# Patient Record
Sex: Male | Born: 1975 | Race: Black or African American | Hispanic: No | Smoking: Current every day smoker
Health system: Southern US, Community
[De-identification: ages and names within clinical notes are randomized; demographics above are authoritative.]

## PROBLEM LIST (undated history)

## (undated) DIAGNOSIS — R519 Headache, unspecified: Secondary | ICD-10-CM

## (undated) DIAGNOSIS — G8929 Other chronic pain: Secondary | ICD-10-CM

## (undated) DIAGNOSIS — R51 Headache: Secondary | ICD-10-CM

## (undated) DIAGNOSIS — I10 Essential (primary) hypertension: Secondary | ICD-10-CM

## (undated) HISTORY — DX: Essential (primary) hypertension: I10

---

## 1999-01-14 ENCOUNTER — Emergency Department (HOSPITAL_COMMUNITY): Admission: EM | Admit: 1999-01-14 | Discharge: 1999-01-14 | Payer: Self-pay | Admitting: Internal Medicine

## 1999-01-16 ENCOUNTER — Emergency Department (HOSPITAL_COMMUNITY): Admission: EM | Admit: 1999-01-16 | Discharge: 1999-01-16 | Payer: Self-pay | Admitting: Emergency Medicine

## 2006-01-17 ENCOUNTER — Emergency Department (HOSPITAL_COMMUNITY): Admission: EM | Admit: 2006-01-17 | Discharge: 2006-01-17 | Payer: Self-pay | Admitting: Emergency Medicine

## 2007-07-08 ENCOUNTER — Emergency Department (HOSPITAL_COMMUNITY): Admission: EM | Admit: 2007-07-08 | Discharge: 2007-07-08 | Payer: Self-pay | Admitting: Emergency Medicine

## 2008-06-23 ENCOUNTER — Emergency Department (HOSPITAL_COMMUNITY): Admission: EM | Admit: 2008-06-23 | Discharge: 2008-06-23 | Payer: Self-pay | Admitting: Emergency Medicine

## 2009-02-25 ENCOUNTER — Emergency Department (HOSPITAL_COMMUNITY): Admission: EM | Admit: 2009-02-25 | Discharge: 2009-02-25 | Payer: Self-pay | Admitting: Emergency Medicine

## 2010-06-23 ENCOUNTER — Emergency Department (HOSPITAL_COMMUNITY): Admission: EM | Admit: 2010-06-23 | Discharge: 2010-06-23 | Payer: Self-pay | Admitting: Emergency Medicine

## 2014-06-10 ENCOUNTER — Encounter (HOSPITAL_COMMUNITY): Payer: Self-pay | Admitting: Emergency Medicine

## 2014-06-10 DIAGNOSIS — R112 Nausea with vomiting, unspecified: Secondary | ICD-10-CM | POA: Insufficient documentation

## 2014-06-10 DIAGNOSIS — R51 Headache: Secondary | ICD-10-CM | POA: Insufficient documentation

## 2014-06-10 DIAGNOSIS — G8929 Other chronic pain: Secondary | ICD-10-CM | POA: Insufficient documentation

## 2014-06-10 DIAGNOSIS — F17200 Nicotine dependence, unspecified, uncomplicated: Secondary | ICD-10-CM | POA: Insufficient documentation

## 2014-06-10 NOTE — ED Notes (Signed)
Pt. reports chronic headache for several years worse these past several days radiating to his right face with occasional nausea/vomitting .

## 2014-06-11 ENCOUNTER — Emergency Department (HOSPITAL_COMMUNITY)
Admission: EM | Admit: 2014-06-11 | Discharge: 2014-06-11 | Discharge status: Against Medical Advice | Payer: Self-pay | Attending: Emergency Medicine | Admitting: Emergency Medicine

## 2014-06-11 HISTORY — DX: Headache, unspecified: R51.9

## 2014-06-11 HISTORY — DX: Other chronic pain: G89.29

## 2014-06-11 HISTORY — DX: Headache: R51

## 2014-08-10 ENCOUNTER — Inpatient Hospital Stay (HOSPITAL_COMMUNITY)
Admission: EM | Admit: 2014-08-10 | Discharge: 2014-08-14 | DRG: 349 | Disposition: A | Discharge status: Home / Self Care | Payer: Self-pay | Attending: Surgery | Admitting: Surgery

## 2014-08-10 ENCOUNTER — Encounter (HOSPITAL_COMMUNITY): Payer: Self-pay | Admitting: Emergency Medicine

## 2014-08-10 ENCOUNTER — Emergency Department (HOSPITAL_COMMUNITY): Payer: Self-pay

## 2014-08-10 DIAGNOSIS — K5900 Constipation, unspecified: Secondary | ICD-10-CM | POA: Diagnosis present

## 2014-08-10 DIAGNOSIS — F17200 Nicotine dependence, unspecified, uncomplicated: Secondary | ICD-10-CM | POA: Diagnosis present

## 2014-08-10 DIAGNOSIS — K612 Anorectal abscess: Principal | ICD-10-CM | POA: Diagnosis present

## 2014-08-10 DIAGNOSIS — K611 Rectal abscess: Secondary | ICD-10-CM | POA: Diagnosis present

## 2014-08-10 LAB — URINALYSIS, ROUTINE W REFLEX MICROSCOPIC
Bilirubin Urine: NEGATIVE
Glucose, UA: NEGATIVE mg/dL
Hgb urine dipstick: NEGATIVE
KETONES UR: NEGATIVE mg/dL
LEUKOCYTES UA: NEGATIVE
Nitrite: NEGATIVE
Protein, ur: NEGATIVE mg/dL
SPECIFIC GRAVITY, URINE: 1.027 (ref 1.005–1.030)
Urobilinogen, UA: 1 mg/dL (ref 0.0–1.0)
pH: 5.5 (ref 5.0–8.0)

## 2014-08-10 LAB — CBC WITH DIFFERENTIAL/PLATELET
BASOS ABS: 0 10*3/uL (ref 0.0–0.1)
BASOS PCT: 0 % (ref 0–1)
EOS ABS: 0.1 10*3/uL (ref 0.0–0.7)
Eosinophils Relative: 1 % (ref 0–5)
HCT: 39.1 % (ref 39.0–52.0)
HEMOGLOBIN: 13.5 g/dL (ref 13.0–17.0)
LYMPHS ABS: 2.7 10*3/uL (ref 0.7–4.0)
LYMPHS PCT: 26 % (ref 12–46)
MCH: 30.8 pg (ref 26.0–34.0)
MCHC: 34.5 g/dL (ref 30.0–36.0)
MCV: 89.1 fL (ref 78.0–100.0)
MONO ABS: 0.8 10*3/uL (ref 0.1–1.0)
Monocytes Relative: 8 % (ref 3–12)
NEUTROS ABS: 6.7 10*3/uL (ref 1.7–7.7)
NEUTROS PCT: 65 % (ref 43–77)
PLATELETS: 253 10*3/uL (ref 150–400)
RBC: 4.39 MIL/uL (ref 4.22–5.81)
RDW: 13.3 % (ref 11.5–15.5)
WBC: 10.4 10*3/uL (ref 4.0–10.5)

## 2014-08-10 LAB — BASIC METABOLIC PANEL
Anion gap: 15 (ref 5–15)
BUN: 12 mg/dL (ref 6–23)
CHLORIDE: 101 meq/L (ref 96–112)
CO2: 24 mEq/L (ref 19–32)
Calcium: 9.7 mg/dL (ref 8.4–10.5)
Creatinine, Ser: 0.96 mg/dL (ref 0.50–1.35)
GFR calc non Af Amer: >90 mL/min (ref >90)
Glucose, Bld: 105 mg/dL — ABNORMAL HIGH (ref 70–99)
Potassium: 4.1 mEq/L (ref 3.7–5.3)
Sodium: 140 mEq/L (ref 137–147)

## 2014-08-10 MED ORDER — IOHEXOL 300 MG/ML  SOLN
100.0000 mL | Freq: Once | INTRAMUSCULAR | Status: AC | PRN
  Administered 2014-08-10: 100 mL via INTRAVENOUS

## 2014-08-10 MED ORDER — HYDROMORPHONE HCL PF 1 MG/ML IJ SOLN
0.5000 mg | Freq: Once | INTRAMUSCULAR | Status: AC
  Administered 2014-08-10: 0.5 mg via INTRAVENOUS
  Filled 2014-08-10: qty 1

## 2014-08-10 MED ORDER — PIPERACILLIN-TAZOBACTAM 3.375 G IVPB 30 MIN
3.3750 g | Freq: Once | INTRAVENOUS | Status: AC
  Administered 2014-08-11: 3.375 g via INTRAVENOUS
  Filled 2014-08-10: qty 50

## 2014-08-10 MED ORDER — SODIUM CHLORIDE 0.9 % IV BOLUS (SEPSIS)
1000.0000 mL | Freq: Once | INTRAVENOUS | Status: AC
  Administered 2014-08-10: 1000 mL via INTRAVENOUS

## 2014-08-10 MED ORDER — HYDROMORPHONE HCL PF 1 MG/ML IJ SOLN
1.0000 mg | Freq: Once | INTRAMUSCULAR | Status: AC
  Administered 2014-08-10: 1 mg via INTRAVENOUS
  Filled 2014-08-10: qty 1

## 2014-08-10 NOTE — ED Notes (Signed)
Patient thought he had hemorrhoids so he tried to use hemorrhoid cream without any success. Denies any blood in stool, nausea or vomiting.

## 2014-08-10 NOTE — ED Notes (Signed)
Pt. Reports rectal pressure/pain x2-3 days with burning and itching. Denies blood in stool, straining with BM, or pain is genitals. Reports pain is worse with sitting.

## 2014-08-10 NOTE — ED Provider Notes (Signed)
CSN: [id number]     Arrival date & time 08/10/14  1601 History   First MD Initiated Contact with Patient 08/10/14 2119     No chief complaint on file.    (Consider location/radiation/quality/duration/timing/severity/associated sxs/prior Treatment) The history is provided by the patient and the spouse.   Ryen Rhames 38 y.o. who presents with tailbone pain and rectal pain. Worst with sitting down. Slightly improved with standing. No improvement with preparation H. This is moderate in severity. No blood in stool. No dysuria. No history of abscesses. No pain with BMs. No history of hemorrhoids. No anal penetration. No fever, testicle pain.  Past Medical History  Diagnosis Date  . Chronic headache    History reviewed. No pertinent past surgical history. No family history on file. History  Substance Use Topics  . Smoking status: Current Every Day Smoker  . Smokeless tobacco: Not on file  . Alcohol Use: Yes    Review of Systems  All other systems reviewed and are negative.     Allergies  Review of patient's allergies indicates no known allergies.  Home Medications   Prior to Admission medications   Medication Sig Start Date End Date Taking? Authorizing Provider  Pramox-PE-Glycerin-Petrolatum (PREPARATION H RE) Place 1 application rectally daily as needed. Hemorroides   Yes Historical Provider, MD   BP 124/76  Pulse 79  Temp(Src) 98.1 F (36.7 C) (Oral)  Resp 18  SpO2 91% Physical Exam  Nursing note and vitals reviewed. Constitutional: He is oriented to person, place, and time. He appears well-developed and well-nourished. No distress.  HENT:  Head: Normocephalic and atraumatic.  Eyes: Conjunctivae and EOM are normal. Right eye exhibits no discharge. Left eye exhibits no discharge.  Neck: Normal range of motion. Neck supple. No tracheal deviation present.  Cardiovascular: Normal rate, regular rhythm and normal heart sounds.  Exam reveals no friction rub.   No murmur  heard. Pulmonary/Chest: Effort normal and breath sounds normal. No stridor. No respiratory distress. He has no wheezes. He has no rales. He exhibits no tenderness.  Abdominal: Soft. He exhibits no distension. There is no tenderness. There is no rebound and no guarding.  Genitourinary: Rectal exam shows tenderness (at 6 o'clock (posteriorly) and left laterally, with possible fluctuance but exam limited due to pain). Rectal exam shows no external hemorrhoid, no fissure and anal tone normal. Right testis shows no mass, no swelling and no tenderness. Left testis shows no mass, no swelling and no tenderness. No phimosis.  Neurological: He is alert and oriented to person, place, and time.  Skin: Skin is warm.  Psychiatric: He has a normal mood and affect.    ED Course  Procedures (including critical care time) Labs Review Labs Reviewed  URINALYSIS, ROUTINE W REFLEX MICROSCOPIC - Abnormal; Notable for the following:    Color, Urine AMBER (*)    All other components within normal limits  BASIC METABOLIC PANEL - Abnormal; Notable for the following:    Glucose, Bld 105 (*)    All other components within normal limits  CBC WITH DIFFERENTIAL    Imaging Review Ct Pelvis W Contrast  08/10/2014   CLINICAL DATA:  Rectal pressure and pain.  Burning and itching.  EXAM: CT PELVIS WITH CONTRAST  TECHNIQUE: Multidetector CT imaging of the pelvis was performed using the standard protocol following the bolus administration of intravenous contrast.  CONTRAST:  OMNIPAQUE IOHEXOL 300 MG/ML  SOLN  COMPARISON:  None.  FINDINGS: A well-defined hyperdense perirectal collection measures 2.3 x 2.8  x 2.5 cm. The more proximal rectum is within normal limits. Sigmoid colon is unremarkable. The visualized ascending and descending colon are normal. The appendix is visualized and normal. The visualized small bowel is within normal limits.  The urinary bladder prostate gland are normal scratch the urinary bladder is  nondistended, likely accounting for wall thickening. The prostate gland is normal. There is no significant free fluid or adenopathy. Bone windows are unremarkable.  IMPRESSION: 1. Posterior perirectal fluid collection measures 2.3 x 2.8 x 2.5 cm. This is most consistent with an abscess or fistula. 2. Otherwise unremarkable CT of the pelvis.   Electronically Signed   By: Gennette Pac M.D.   On: 08/10/2014 22:46     EKG Interpretation None      MDM   Final diagnoses:  Perirectal abscess    Pt presents with rectal pain. No blood in stool. No hemorrhoids noted. No penetration to anus reportedly. Pain with BMs. + rectal pain with possible fluctuant mass. Dilaudid given for pain. No leukocytosis. BMP wnls. CT pending for possible perirectal abscess. + perirectal abscess. Patient was instructed after I examined him to refrain from eating or drinking anything. Gen surgery consulted. gen surgery to admit. Will keep NPO after midnight. Admitted without any events. Started on Zosyn at surgery recommendation.  Care discussed with my attending, Dr. Bebe Shaggy.     Sena Hitch, MD 08/10/14 2350

## 2014-08-10 NOTE — ED Notes (Signed)
Nurse at bedside when ED Resident assessed patient. Family member at patient's bedside.

## 2014-08-11 ENCOUNTER — Encounter (HOSPITAL_COMMUNITY): Admission: EM | Disposition: A | Discharge status: Home / Self Care | Payer: Self-pay | Source: Home / Self Care

## 2014-08-11 ENCOUNTER — Encounter (HOSPITAL_COMMUNITY): Payer: Self-pay | Admitting: Certified Registered Nurse Anesthetist

## 2014-08-11 ENCOUNTER — Observation Stay (HOSPITAL_COMMUNITY): Payer: Self-pay | Admitting: Certified Registered Nurse Anesthetist

## 2014-08-11 DIAGNOSIS — K612 Anorectal abscess: Secondary | ICD-10-CM

## 2014-08-11 HISTORY — PX: EXAMINATION UNDER ANESTHESIA: SHX1540

## 2014-08-11 HISTORY — PX: INCISION AND DRAINAGE PERIRECTAL ABSCESS: SHX1804

## 2014-08-11 LAB — SURGICAL PCR SCREEN
MRSA, PCR: NEGATIVE
STAPHYLOCOCCUS AUREUS: NEGATIVE

## 2014-08-11 SURGERY — EXAM UNDER ANESTHESIA
Anesthesia: General

## 2014-08-11 MED ORDER — LACTATED RINGERS IV SOLN
INTRAVENOUS | Status: DC | PRN
  Administered 2014-08-11: 09:00:00 via INTRAVENOUS

## 2014-08-11 MED ORDER — FENTANYL CITRATE 0.05 MG/ML IJ SOLN
INTRAMUSCULAR | Status: DC | PRN
  Administered 2014-08-11 (×2): 50 ug via INTRAVENOUS

## 2014-08-11 MED ORDER — MIDAZOLAM HCL 5 MG/5ML IJ SOLN
INTRAMUSCULAR | Status: DC | PRN
  Administered 2014-08-11: 2 mg via INTRAVENOUS

## 2014-08-11 MED ORDER — SUCCINYLCHOLINE CHLORIDE 20 MG/ML IJ SOLN
INTRAMUSCULAR | Status: DC | PRN
  Administered 2014-08-11: 100 mg via INTRAVENOUS

## 2014-08-11 MED ORDER — MIDAZOLAM HCL 2 MG/2ML IJ SOLN
INTRAMUSCULAR | Status: AC
Start: 2014-08-11 — End: 2014-08-11
  Filled 2014-08-11: qty 2

## 2014-08-11 MED ORDER — LIDOCAINE HCL (CARDIAC) 20 MG/ML IV SOLN
INTRAVENOUS | Status: AC
  Filled 2014-08-11: qty 10

## 2014-08-11 MED ORDER — LIDOCAINE HCL (CARDIAC) 20 MG/ML IV SOLN
INTRAVENOUS | Status: DC | PRN
  Administered 2014-08-11: 60 mg via INTRAVENOUS

## 2014-08-11 MED ORDER — SUCCINYLCHOLINE CHLORIDE 20 MG/ML IJ SOLN
INTRAMUSCULAR | Status: AC
  Filled 2014-08-11: qty 2

## 2014-08-11 MED ORDER — FENTANYL CITRATE 0.05 MG/ML IJ SOLN
INTRAMUSCULAR | Status: AC
  Filled 2014-08-11: qty 5

## 2014-08-11 MED ORDER — OXYCODONE HCL 5 MG PO TABS
5.0000 mg | ORAL_TABLET | ORAL | Status: DC | PRN
  Administered 2014-08-11 – 2014-08-13 (×7): 10 mg via ORAL
  Filled 2014-08-11 (×7): qty 2

## 2014-08-11 MED ORDER — ONDANSETRON HCL 4 MG/2ML IJ SOLN
INTRAMUSCULAR | Status: DC | PRN
  Administered 2014-08-11: 4 mg via INTRAVENOUS

## 2014-08-11 MED ORDER — DEXTROSE IN LACTATED RINGERS 5 % IV SOLN
INTRAVENOUS | Status: DC
  Administered 2014-08-11 (×2): via INTRAVENOUS

## 2014-08-11 MED ORDER — ONDANSETRON HCL 4 MG/2ML IJ SOLN
INTRAMUSCULAR | Status: AC
  Filled 2014-08-11: qty 2

## 2014-08-11 MED ORDER — PIPERACILLIN-TAZOBACTAM 3.375 G IVPB
3.3750 g | Freq: Three times a day (TID) | INTRAVENOUS | Status: DC
Start: 2014-08-11 — End: 2014-08-14
  Administered 2014-08-11 – 2014-08-14 (×9): 3.375 g via INTRAVENOUS
  Filled 2014-08-11 (×11): qty 50

## 2014-08-11 MED ORDER — HYDROMORPHONE HCL PF 1 MG/ML IJ SOLN
INTRAMUSCULAR | Status: AC
  Administered 2014-08-11: 0.5 mg via INTRAVENOUS
  Filled 2014-08-11: qty 1

## 2014-08-11 MED ORDER — DEXTROSE IN LACTATED RINGERS 5 % IV SOLN
INTRAVENOUS | Status: DC
  Administered 2014-08-12: 09:00:00 via INTRAVENOUS

## 2014-08-11 MED ORDER — HYDROMORPHONE HCL PF 1 MG/ML IJ SOLN
1.0000 mg | INTRAMUSCULAR | Status: DC | PRN
Start: 2014-08-11 — End: 2014-08-14
  Administered 2014-08-11 – 2014-08-13 (×12): 1 mg via INTRAVENOUS
  Filled 2014-08-11 (×12): qty 1

## 2014-08-11 MED ORDER — PROPOFOL 10 MG/ML IV BOLUS
INTRAVENOUS | Status: AC
  Filled 2014-08-11: qty 20

## 2014-08-11 MED ORDER — ROCURONIUM BROMIDE 50 MG/5ML IV SOLN
INTRAVENOUS | Status: AC
  Filled 2014-08-11: qty 1

## 2014-08-11 MED ORDER — HYDROMORPHONE HCL PF 1 MG/ML IJ SOLN
0.2500 mg | INTRAMUSCULAR | Status: DC | PRN
  Administered 2014-08-11 (×2): 0.5 mg via INTRAVENOUS

## 2014-08-11 MED ORDER — PROPOFOL 10 MG/ML IV BOLUS
INTRAVENOUS | Status: DC | PRN
  Administered 2014-08-11: 50 mg via INTRAVENOUS
  Administered 2014-08-11: 150 mg via INTRAVENOUS

## 2014-08-11 SURGICAL SUPPLY — 39 items
CANISTER SUCTION 2500CC (MISCELLANEOUS) ×4 IMPLANT
CLEANER TIP ELECTROSURG 2X2 (MISCELLANEOUS) IMPLANT
COVER SURGICAL LIGHT HANDLE (MISCELLANEOUS) ×4 IMPLANT
DRAPE PROXIMA HALF (DRAPES) ×2 IMPLANT
DRAPE UTILITY 15X26 W/TAPE STR (DRAPE) ×8 IMPLANT
DRSG PAD ABDOMINAL 8X10 ST (GAUZE/BANDAGES/DRESSINGS) ×4 IMPLANT
ELECT REM PT RETURN 9FT ADLT (ELECTROSURGICAL)
ELECTRODE REM PT RTRN 9FT ADLT (ELECTROSURGICAL) IMPLANT
GAUZE PACKING IODOFORM 1 (PACKING) IMPLANT
GAUZE PACKING IODOFORM 1/4X15 (GAUZE/BANDAGES/DRESSINGS) ×2 IMPLANT
GAUZE SPONGE 4X4 12PLY STRL (GAUZE/BANDAGES/DRESSINGS) ×4 IMPLANT
GAUZE SPONGE 4X4 16PLY XRAY LF (GAUZE/BANDAGES/DRESSINGS) ×4 IMPLANT
GEL ULTRASOUND 8.5O AQUASONIC (MISCELLANEOUS) ×4 IMPLANT
GLOVE BIO SURGEON STRL SZ8 (GLOVE) ×4 IMPLANT
GLOVE BIOGEL PI IND STRL 7.0 (GLOVE) IMPLANT
GLOVE BIOGEL PI IND STRL 8 (GLOVE) ×2 IMPLANT
GLOVE BIOGEL PI INDICATOR 7.0 (GLOVE) ×2
GLOVE BIOGEL PI INDICATOR 8 (GLOVE) ×2
GOWN STRL REUS W/ TWL LRG LVL3 (GOWN DISPOSABLE) ×4 IMPLANT
GOWN STRL REUS W/ TWL XL LVL3 (GOWN DISPOSABLE) ×2 IMPLANT
GOWN STRL REUS W/TWL LRG LVL3 (GOWN DISPOSABLE) ×8
GOWN STRL REUS W/TWL XL LVL3 (GOWN DISPOSABLE) ×4
KIT BASIN OR (CUSTOM PROCEDURE TRAY) ×4 IMPLANT
KIT ROOM TURNOVER OR (KITS) ×4 IMPLANT
NS IRRIG 1000ML POUR BTL (IV SOLUTION) ×4 IMPLANT
PACK LITHOTOMY IV (CUSTOM PROCEDURE TRAY) ×4 IMPLANT
PAD ABD 8X10 STRL (GAUZE/BANDAGES/DRESSINGS) ×2 IMPLANT
PAD ARMBOARD 7.5X6 YLW CONV (MISCELLANEOUS) ×8 IMPLANT
PENCIL BUTTON HOLSTER BLD 10FT (ELECTRODE) IMPLANT
SWAB COLLECTION DEVICE MRSA (MISCELLANEOUS) ×4 IMPLANT
SYR BULB IRRIGATION 50ML (SYRINGE) ×2 IMPLANT
TOWEL OR 17X24 6PK STRL BLUE (TOWEL DISPOSABLE) ×4 IMPLANT
TOWEL OR 17X26 10 PK STRL BLUE (TOWEL DISPOSABLE) ×4 IMPLANT
TUBE ANAEROBIC SPECIMEN COL (MISCELLANEOUS) ×4 IMPLANT
TUBE CONNECTING 12'X1/4 (SUCTIONS) ×1
TUBE CONNECTING 12X1/4 (SUCTIONS) ×3 IMPLANT
UNDERPAD 30X30 INCONTINENT (UNDERPADS AND DIAPERS) ×4 IMPLANT
WATER STERILE IRR 1000ML POUR (IV SOLUTION) ×4 IMPLANT
YANKAUER SUCT BULB TIP NO VENT (SUCTIONS) ×4 IMPLANT

## 2014-08-11 NOTE — Progress Notes (Signed)
Patient ID: Paul Prince, male   DOB: [birth year], 38 y.o.   MRN: [id number] Patient examined and I agree with Dr. Arita Miss assessment. For EUA/drainage perirectal abscess. Porcedure, risks, benefits D/W him and he agrees.  Violeta Gelinas, MD, MPH, FACS Trauma: [phone] General Surgery: [phone]  08/11/2014 7:42 AM

## 2014-08-11 NOTE — H&P (Signed)
Paul Prince is an 37 y.o. male.   Chief Complaint: rectal pain HPI:  [name] admitted with worsening rectal pain for several days.  He has had subjective fever/ chills.  He denies drainage.  He has not had this happen before.  He denies bleeding.  He thought it was a hemorrhoid and was using preparation H without relief.  He has had some constipation.    Past Medical History  Diagnosis Date  . Chronic headache     History reviewed. No pertinent past surgical history.  No family history on file. Social History:  reports that he has been smoking.  He does not have any smokeless tobacco history on file. He reports that he drinks alcohol. He reports that he does not use illicit drugs.  Allergies: No Known Allergies  Medications Prior to Admission  Medication Sig Dispense Refill  . Pramox-PE-Glycerin-Petrolatum (PREPARATION H RE) Place 1 application rectally daily as needed. Hemorroides        Results for orders placed during the hospital encounter of 08/10/14 (from the past 48 hour(s))  CBC WITH DIFFERENTIAL     Status: None   Collection Time    08/10/14  9:55 PM      Result Value Ref Range   WBC 10.4  4.0 - 10.5 K/uL   RBC 4.39  4.22 - 5.81 MIL/uL   Hemoglobin 13.5  13.0 - 17.0 g/dL   HCT 39.1  39.0 - 52.0 %   MCV 89.1  78.0 - 100.0 fL   MCH 30.8  26.0 - 34.0 pg   MCHC 34.5  30.0 - 36.0 g/dL   RDW 13.3  11.5 - 15.5 %   Platelets 253  150 - 400 K/uL   Neutrophils Relative % 65  43 - 77 %   Neutro Abs 6.7  1.7 - 7.7 K/uL   Lymphocytes Relative 26  12 - 46 %   Lymphs Abs 2.7  0.7 - 4.0 K/uL   Monocytes Relative 8  3 - 12 %   Monocytes Absolute 0.8  0.1 - 1.0 K/uL   Eosinophils Relative 1  0 - 5 %   Eosinophils Absolute 0.1  0.0 - 0.7 K/uL   Basophils Relative 0  0 - 1 %   Basophils Absolute 0.0  0.0 - 0.1 K/uL  BASIC METABOLIC PANEL     Status: Abnormal   Collection Time    08/10/14  9:55 PM      Result Value Ref Range   Sodium 140  137 - 147 mEq/L   Potassium 4.1   3.7 - 5.3 mEq/L   Chloride 101  96 - 112 mEq/L   CO2 24  19 - 32 mEq/L   Glucose, Bld 105 (*) 70 - 99 mg/dL   BUN 12  6 - 23 mg/dL   Creatinine, Ser 0.96  0.50 - 1.35 mg/dL   Calcium 9.7  8.4 - 10.5 mg/dL   GFR calc non Af Amer >90  >90 mL/min   GFR calc Af Amer >90  >90 mL/min   Comment: (NOTE)     The eGFR has been calculated using the CKD EPI equation.     This calculation has not been validated in all clinical situations.     eGFR's persistently <90 mL/min signify possible Chronic Kidney     Disease.   Anion gap 15  5 - 15  URINALYSIS, ROUTINE W REFLEX MICROSCOPIC     Status: Abnormal   Collection Time      08/10/14 10:05 PM      Result Value Ref Range   Color, Urine AMBER (*) YELLOW   Comment: BIOCHEMICALS MAY BE AFFECTED BY COLOR   APPearance CLEAR  CLEAR   Specific Gravity, Urine 1.027  1.005 - 1.030   pH 5.5  5.0 - 8.0   Glucose, UA NEGATIVE  NEGATIVE mg/dL   Hgb urine dipstick NEGATIVE  NEGATIVE   Bilirubin Urine NEGATIVE  NEGATIVE   Ketones, ur NEGATIVE  NEGATIVE mg/dL   Protein, ur NEGATIVE  NEGATIVE mg/dL   Urobilinogen, UA 1.0  0.0 - 1.0 mg/dL   Nitrite NEGATIVE  NEGATIVE   Leukocytes, UA NEGATIVE  NEGATIVE   Comment: MICROSCOPIC NOT DONE ON URINES WITH NEGATIVE PROTEIN, BLOOD, LEUKOCYTES, NITRITE, OR GLUCOSE <1000 mg/dL.   Ct Pelvis W Contrast  08/10/2014   CLINICAL DATA:  Rectal pressure and pain.  Burning and itching.  EXAM: CT PELVIS WITH CONTRAST  TECHNIQUE: Multidetector CT imaging of the pelvis was performed using the standard protocol following the bolus administration of intravenous contrast.  CONTRAST:  100mL OMNIPAQUE IOHEXOL 300 MG/ML  SOLN  COMPARISON:  None.  FINDINGS: A well-defined hyperdense perirectal collection measures 2.3 x 2.8 x 2.5 cm. The more proximal rectum is within normal limits. Sigmoid colon is unremarkable. The visualized ascending and descending colon are normal. The appendix is visualized and normal. The visualized small bowel is within  normal limits.  The urinary bladder prostate gland are normal scratch the urinary bladder is nondistended, likely accounting for wall thickening. The prostate gland is normal. There is no significant free fluid or adenopathy. Bone windows are unremarkable.  IMPRESSION: 1. Posterior perirectal fluid collection measures 2.3 x 2.8 x 2.5 cm. This is most consistent with an abscess or fistula. 2. Otherwise unremarkable CT of the pelvis.   Electronically Signed   By: Chris  Mattern M.D.   On: 08/10/2014 22:46    Review of Systems  Constitutional: Positive for chills.  Gastrointestinal:       Rectal pain  All other systems reviewed and are negative.   Blood pressure 93/62, pulse 75, temperature 98.6 F (37 C), temperature source Oral, resp. rate 18, SpO2 97.00%. Physical Exam  Constitutional: He is oriented to person, place, and time. He appears well-developed and well-nourished.  HENT:  Head: Normocephalic and atraumatic.  Eyes: Conjunctivae are normal. Pupils are equal, round, and reactive to light. No scleral icterus.  Neck: Normal range of motion. Neck supple.  Cardiovascular: Normal rate.   Respiratory: Effort normal. No respiratory distress.  GI: Soft. He exhibits no distension.  Genitourinary:     pain  Musculoskeletal: Normal range of motion. He exhibits no edema.  Neurological: He is alert and oriented to person, place, and time.  Skin: Skin is warm and dry. No rash noted. No erythema. No pallor.  Psychiatric: He has a normal mood and affect. His behavior is normal. Judgment and thought content normal.     Assessment/Plan Perirectal abscess IV antibiotics NPO To OR for EUA with Dr. Thompson.   Reviewed that wound would be left open and may require dressing changes.    Paul Prince 08/11/2014, 6:49 AM    

## 2014-08-11 NOTE — Transfer of Care (Signed)
Immediate Anesthesia Transfer of Care Note  Patient: Paul Prince  Procedure(s) Performed: Procedure(s): EXAM UNDER ANESTHESIA (N/A) IRRIGATION AND DEBRIDEMENT PERIRECTAL ABSCESS  Patient Location: PACU  Anesthesia Type:General  Level of Consciousness: awake, alert  and oriented  Airway & Oxygen Therapy: Patient Spontanous Breathing and Patient connected to nasal cannula oxygen  Post-op Assessment: Report given to PACU RN, Post -op Vital signs reviewed and stable and Patient moving all extremities X 4  Post vital signs: Reviewed and stable  Complications: No apparent anesthesia complications

## 2014-08-11 NOTE — Op Note (Signed)
08/10/2014 - 08/11/2014  9:42 AM  PATIENT:  Paul Prince  38 y.o. male  PRE-OPERATIVE DIAGNOSIS:  Perirectal Abscess  POST-OPERATIVE DIAGNOSIS:  Perirectal Abscess  PROCEDURE:  Procedure(s): EXAM UNDER ANESTHESIA INCISION AND DRAINAGE PERIRECTAL ABSCESS  SURGEON:  Surgeon(s): Liz Malady, MD  ASSISTANTS: none   ANESTHESIA:   general  EBL:  Total I/O In: 700 [I.V.:700] Out: 300 [Urine:300]  BLOOD ADMINISTERED:none  DRAINS: none   SPECIMEN:  No Specimen  DISPOSITION OF SPECIMEN:  N/A  COUNTS:  YES  DICTATION: .Dragon Dictation  Patient presents for examination under anesthesia and drainage of perirectal abscess. He was identified in the palpable area. He received intravenous antibiotics this morning. He was brought to the operating room and general anesthesia was administered by the anesthesia staff. Was placed in lithotomy position. Perianal area was prepped and draped in sterile fashion. We did time out procedure. Digital rectal exam revealed no palpable mass posteriorly where the abscess was located. No other masses were noted. Incision was made posterior to the opening. Blunt dissection was used to track along the backside of the rectal area and I entered a pocket of purulent material. The pocket was opened bluntly and contents were sent for cultures. No other loculations were noted. Wound was copiously irrigated and then packed with quarter-inch iodoform gauze. I repeated his rectal exam and no connection to the rectum was noted. A bulky sterile dressing was applied. All counts were correct. Patient tolerated the procedure well without apparent complication was taken recovery in stable condition.  PATIENT DISPOSITION:  PACU - hemodynamically stable.   Delay start of Pharmacological VTE agent (>24hrs) due to surgical blood loss or risk of bleeding:  no  Violeta Gelinas, MD, MPH, FACS Pager: [phone]  8/29/20159:42 AM

## 2014-08-11 NOTE — Anesthesia Preprocedure Evaluation (Addendum)
Anesthesia Evaluation  Patient identified by MRN, date of birth, ID band Patient awake  General Assessment Comment:History noted. CE  Reviewed: Allergy & Precautions, H&P , NPO status , Patient's Chart, lab work & pertinent test results  Airway Mallampati: II TM Distance: >3 FB Neck ROM: Full    Dental  (+) Dental Advisory Given, Teeth Intact   Pulmonary Current Smoker,  breath sounds clear to auscultation        Cardiovascular negative cardio ROS  Rhythm:Regular Rate:Normal     Neuro/Psych  Headaches,    GI/Hepatic negative GI ROS, Neg liver ROS,   Endo/Other  negative endocrine ROS  Renal/GU negative Renal ROS     Musculoskeletal   Abdominal   Peds  Hematology   Anesthesia Other Findings   Reproductive/Obstetrics                        Anesthesia Physical Anesthesia Plan  ASA: II  Anesthesia Plan: General   Post-op Pain Management:    Induction: Intravenous  Airway Management Planned: Oral ETT  Additional Equipment:   Intra-op Plan:   Post-operative Plan: Extubation in OR  Informed Consent: I have reviewed the patients History and Physical, chart, labs and discussed the procedure including the risks, benefits and alternatives for the proposed anesthesia with the patient or authorized representative who has indicated his/her understanding and acceptance.   Dental advisory given  Plan Discussed with: CRNA, Anesthesiologist and Surgeon  Anesthesia Plan Comments:        Anesthesia Quick Evaluation

## 2014-08-11 NOTE — Progress Notes (Signed)
UR completed 

## 2014-08-11 NOTE — Anesthesia Postprocedure Evaluation (Signed)
  Anesthesia Post-op Note  Patient: Paul Prince  Procedure(s) Performed: Procedure(s): EXAM UNDER ANESTHESIA (N/A) IRRIGATION AND DEBRIDEMENT PERIRECTAL ABSCESS  Patient Location: PACU  Anesthesia Type:General  Level of Consciousness: awake  Airway and Oxygen Therapy: Patient Spontanous Breathing  Post-op Pain: mild  Post-op Assessment: Post-op Vital signs reviewed  Post-op Vital Signs: Reviewed  Last Vitals:  Filed Vitals:   08/11/14 1011  BP: 136/88  Pulse: 60  Temp:   Resp: 15    Complications: No apparent anesthesia complications

## 2014-08-11 NOTE — ED Provider Notes (Signed)
I have personally seen and examined the patient.  I have discussed the plan of care with the resident.  I have reviewed the documentation on PMH/FH/Soc. History.  I have reviewed the documentation of the resident and agree.  Pt noted to have perirectal abscess He was stable in the ED D/w dr Donell Beers, will admit to gen. surgery  Joya Gaskins, MD 08/11/14 [phone]

## 2014-08-11 NOTE — ED Notes (Signed)
Report given to [facility], Charity fundraiser.

## 2014-08-12 MED ORDER — ENOXAPARIN SODIUM 40 MG/0.4ML ~~LOC~~ SOLN
40.0000 mg | SUBCUTANEOUS | Status: DC
  Administered 2014-08-12: 40 mg via SUBCUTANEOUS
  Filled 2014-08-12 (×3): qty 0.4

## 2014-08-12 NOTE — Progress Notes (Signed)
1 Day Post-Op  Subjective: Alert and stable. Friendly and cooperative. Says he's  still having some pain and drainage. Afebrile. Heart rate 69. BP 118/65.  Objective: Vital signs in last 24 hours: Temp:  [97.2 F (36.2 C)-99.3 F (37.4 C)] 99.3 F (37.4 C) (08/30 0700) Pulse Rate:  [60-83] 69 (08/30 0700) Resp:  [10-18] 16 (08/30 0700) BP: (111-143)/(50-88) 118/55 mmHg (08/30 0700) SpO2:  [96 %-100 %] 98 % (08/30 0700) Weight:  [190 lb (86.183 kg)] 190 lb (86.183 kg) (08/30 0236) Last BM Date: 08/08/14  Intake/Output from previous day: 08/29 0701 - 08/30 0700 In: 2999.2 [P.O.:720; I.V.:2179.2; IV Piggyback:100] Out: 1130 [Urine:1130] Intake/Output this shift: Total I/O In: -  Out: 600 [Urine:600]   EXAM: General appearance: Alert. Cooperative. No real distress. Mental status normal. GI: soft, non-tender; bowel sounds normal; no masses,  no organomegaly Incision/Wound:  Rectal area examined. Moderate old blood on bandage. Drain removed. No active bleeding. The wound looks good.  Lab Results:  Results for orders placed during the hospital encounter of 08/10/14 (from the past 24 hour(s))  CULTURE, ROUTINE-ABSCESS     Status: None   Collection Time    08/11/14  9:33 AM      Result Value Ref Range   Specimen Description ABSCESS PERIRECTAL     Special Requests PATIENT ON FOLLOWING ZOSYN     Gram Stain PENDING     Culture       Value: NO GROWTH 1 DAY     Performed at [facility]   Report Status PENDING       Studies/Results: No results found.  . piperacillin-tazobactam (ZOSYN)  IV  3.375 g Intravenous 3 times per day     Assessment/Plan: s/p Procedure(s): EXAM UNDER ANESTHESIA IRRIGATION AND DEBRIDEMENT PERIRECTAL ABSCESS  POD #1. Incision and drainage of pararectal abscess, posterior midline. Doing well   drain removed today Begin sitz baths Continue Zosyn Continue hospitalization for observation for bleeding and pain control  hopefully home  tomorrow.  @  LOS: 2 days    Jud Fanguy M 08/12/2014  . .prob

## 2014-08-12 NOTE — Progress Notes (Signed)
Sitz bath done with assistance by SO.Marland Kitchen  Blood clots noted falling down on sitz bath.  Pt. States "i'm quite relieve".

## 2014-08-13 LAB — APTT: APTT: 41 s — AB (ref 24–37)

## 2014-08-13 LAB — CBC
HCT: 30.5 % — ABNORMAL LOW (ref 39.0–52.0)
Hemoglobin: 10.6 g/dL — ABNORMAL LOW (ref 13.0–17.0)
MCH: 30.6 pg (ref 26.0–34.0)
MCHC: 34.8 g/dL (ref 30.0–36.0)
MCV: 88.2 fL (ref 78.0–100.0)
PLATELETS: 264 10*3/uL (ref 150–400)
RBC: 3.46 MIL/uL — ABNORMAL LOW (ref 4.22–5.81)
RDW: 13.1 % (ref 11.5–15.5)
WBC: 10.5 10*3/uL (ref 4.0–10.5)

## 2014-08-13 LAB — PROTIME-INR
INR: 1.13 (ref 0.00–1.49)
PROTHROMBIN TIME: 14.5 s (ref 11.6–15.2)

## 2014-08-13 MED ORDER — POLYETHYLENE GLYCOL 3350 17 G PO PACK
17.0000 g | PACK | Freq: Once | ORAL | Status: AC
  Administered 2014-08-13: 17 g via ORAL
  Filled 2014-08-13: qty 1

## 2014-08-13 MED ORDER — DOCUSATE SODIUM 100 MG PO CAPS
100.0000 mg | ORAL_CAPSULE | Freq: Two times a day (BID) | ORAL | Status: DC
  Administered 2014-08-13 (×2): 100 mg via ORAL
  Filled 2014-08-13: qty 1

## 2014-08-13 NOTE — Progress Notes (Addendum)
2 Days Post-Op  Subjective: Alert. Feeling better. Passing lots of flatus but hasn't had a stool yet. Patient and significant other concern because he still having bloody drainage. Fearful that he will bleed at home. Nursing staff states he does have bloody clots anddrainage on bandage. Bandage was changed 4 times yesterday. Has had one sitz bath. Afebrile. Stable.  Objective: Vital signs in last 24 hours: Temp:  [99.3 F (37.4 C)-99.5 F (37.5 C)] 99.3 F (37.4 C) (08/31 0446) Pulse Rate:  [69-85] 85 (08/31 0446) Resp:  [16-18] 17 (08/31 0446) BP: (110-122)/(55-63) 110/59 mmHg (08/31 0446) SpO2:  [97 %-99 %] 98 % (08/31 0446) Last BM Date: 08/08/14  Intake/Output from previous day: 08/30 0701 - 08/31 0700 In: 2408.3 [P.O.:1060; I.V.:1198.3; IV Piggyback:150] Out: 3050 [Urine:3050] Intake/Output this shift: Total I/O In: 1230 [P.O.:580; I.V.:550; IV Piggyback:100] Out: 1100 [Urine:1100]  General appearance: alert. Cooperative. Friendly.  No distress. Mental status normal. GI: soft, non-tender; bowel sounds normal; no masses,  no organomegaly Skin: Skin color, texture, turgor normal. No rashes or lesions or perianal incision looks good. No purulence or odor. Cellulitis abscess. Blood clot present but no active bleeding. Redressed  Lab Results:  No results found for this or any previous visit (from the past 24 hour(s)).   Studies/Results: No results found.  . enoxaparin (LOVENOX) injection  40 mg Subcutaneous Q24H  . piperacillin-tazobactam (ZOSYN)  IV  3.375 g Intravenous 3 times per day     Assessment/Plan: s/p Procedure(s): EXAM UNDER ANESTHESIA IRRIGATION AND DEBRIDEMENT PERIRECTAL ABSCESS  POD #2. Incision and drainage of perirectal abscess, posterior midline.  Doing well, Suspected the bleeding will be self-limited. Check a CBC and coags today. drain removed yesterday Sitz  TID Continue Zosyn  Continue hospitalization for observation for bleeding and pain  control  hopefully home tomorrow.   @  LOS: 3 days    Tyrian Peart M 08/13/2014  . .prob

## 2014-08-14 ENCOUNTER — Encounter (HOSPITAL_COMMUNITY): Payer: Self-pay | Admitting: General Surgery

## 2014-08-14 MED ORDER — AMOXICILLIN-POT CLAVULANATE 875-125 MG PO TABS: 1.0000 | ORAL_TABLET | Freq: Two times a day (BID) | ORAL | Status: DC

## 2014-08-14 MED ORDER — OXYCODONE HCL 5 MG PO TABS: 5.0000 mg | ORAL_TABLET | ORAL | Status: DC | PRN

## 2014-08-14 NOTE — Care Management Note (Signed)
  Page 1 of 1   08/14/2014     1:09:09 PM CARE MANAGEMENT NOTE 08/14/2014  Patient:  Paul Prince, Paul Prince   Account Number:  [id number]  Date Initiated:  08/14/2014  Documentation initiated by:  Ronny Flurry  Subjective/Objective Assessment:     Action/Plan:   Anticipated DC Date:  08/14/2014   Anticipated DC Plan:  HOME/SELF CARE  In-house referral  Financial Counselor      DC Planning Services  [facility]  [facility]      Choice offered to / List presented to:             Status of service:   Medicare Important Message given?   (If response is "NO", the following Medicare IM given date fields will be blank) Date Medicare IM given:   Medicare IM given by:   Date Additional Medicare IM given:   Additional Medicare IM given by:    Discharge Disposition:    Per UR Regulation:    If discussed at Long Length of Stay Meetings, dates discussed:    Comments:  08-14-14 MATCH letter given , exception placed for 14 day supply of pain medication. Ronny Flurry RN BSN

## 2014-08-14 NOTE — Discharge Summary (Signed)
Physician Discharge Summary  Patient ID: Paul Prince MRN: [id number] DOB/AGE: Sep 15, 1976 38 y.o.  Admit date: 08/10/2014 Discharge date: 08/14/2014  Admission Diagnoses:Posterior perirectal abscess  Discharge Diagnoses: Posterior perirectal abscess Active Problems:   Perirectal abscess   Discharged Condition: good  Hospital Course: Patient was admitted with a posterior perirectal abscess. He underwent examination under anesthesia and drainage of the abscess in the operating room. Postoperatively he had some oozing and he was monitored for an extra day. He was treated with IV antibiotics. Cultures were negative as of time of discharge. The bleeding stopped and he is discharged home in stable condition.  Consults: None  Significant Diagnostic Studies: CT  Treatments: surgery: Above  Discharge Exam: Blood pressure 108/61, pulse 73, temperature 99.4 F (37.4 C), temperature source Oral, resp. rate 17, height 6' (1.829 m), weight 190 lb (86.183 kg), SpO2 100.00%. General appearance: alert and cooperative wound much softer with no significant drainage  Disposition: 07-Left Against Medical Advice  Discharge Instructions   Diet - low sodium heart healthy    Complete by:  As directed      Discharge instructions    Complete by:  As directed   Sit in a tub twice a day, especially after bowel movements. Keep wound covered with gauze until drainage stops.     Discharge wound care:    Complete by:  As directed   See above     Increase activity slowly    Complete by:  As directed             Medication List    STOP taking these medications       PREPARATION H RE      TAKE these medications       oxyCODONE 5 MG immediate release tablet  Commonly known as:  Oxy IR/ROXICODONE  Take 1-2 tablets (5-10 mg total) by mouth every 4 (four) hours as needed (pain).       Augmentin  1 by mouth twice a day for 7 days     Follow-up Information   Follow up with [facility]  E, MD. Schedule an appointment as soon as possible for a visit in 2 weeks. (For wound re-check)    Specialty:  General Surgery   Contact information:   [address] [place] [address] [phone]       Signed: Liz Malady 08/14/2014, 8:58 AM

## 2014-08-14 NOTE — Progress Notes (Signed)
Patient given discharge paperwork. Prescriptions given to patient. Patient did not voice any questions or concerns. Patient is ready for discharge.

## 2014-08-15 LAB — CULTURE, ROUTINE-ABSCESS
CULTURE: NO GROWTH
Gram Stain: NONE SEEN

## 2014-08-16 LAB — ANAEROBIC CULTURE: Gram Stain: NONE SEEN

## 2014-08-27 ENCOUNTER — Encounter (INDEPENDENT_AMBULATORY_CARE_PROVIDER_SITE_OTHER): Payer: Self-pay

## 2014-10-23 ENCOUNTER — Encounter: Payer: Self-pay | Admitting: Geriatric Medicine

## 2014-10-23 ENCOUNTER — Ambulatory Visit (INDEPENDENT_AMBULATORY_CARE_PROVIDER_SITE_OTHER): Payer: Managed Care, Other (non HMO) | Admitting: Internal Medicine

## 2014-10-23 ENCOUNTER — Encounter: Payer: Self-pay | Admitting: Internal Medicine

## 2014-10-23 ENCOUNTER — Other Ambulatory Visit (INDEPENDENT_AMBULATORY_CARE_PROVIDER_SITE_OTHER): Payer: Managed Care, Other (non HMO)

## 2014-10-23 VITALS — BP 148/82 | HR 80 | Temp 98.6°F | Resp 14 | Ht 72.0 in | Wt 205.0 lb

## 2014-10-23 DIAGNOSIS — G44009 Cluster headache syndrome, unspecified, not intractable: Secondary | ICD-10-CM | POA: Insufficient documentation

## 2014-10-23 DIAGNOSIS — R7302 Impaired glucose tolerance (oral): Secondary | ICD-10-CM

## 2014-10-23 DIAGNOSIS — Z0000 Encounter for general adult medical examination without abnormal findings: Secondary | ICD-10-CM

## 2014-10-23 DIAGNOSIS — K611 Rectal abscess: Secondary | ICD-10-CM

## 2014-10-23 DIAGNOSIS — Z13220 Encounter for screening for lipoid disorders: Secondary | ICD-10-CM

## 2014-10-23 DIAGNOSIS — Z720 Tobacco use: Secondary | ICD-10-CM

## 2014-10-23 DIAGNOSIS — G44019 Episodic cluster headache, not intractable: Secondary | ICD-10-CM

## 2014-10-23 LAB — LIPID PANEL
Cholesterol: 170 mg/dL (ref 0–200)
HDL: 33.9 mg/dL — ABNORMAL LOW (ref >39.00)
LDL CALC: 113 mg/dL — AB (ref 0–99)
NonHDL: 136.1
TRIGLYCERIDES: 117 mg/dL (ref 0.0–149.0)
Total CHOL/HDL Ratio: 5
VLDL: 23.4 mg/dL (ref 0.0–40.0)

## 2014-10-23 LAB — BASIC METABOLIC PANEL
BUN: 14 mg/dL (ref 6–23)
CHLORIDE: 104 meq/L (ref 96–112)
CO2: 30 meq/L (ref 19–32)
Calcium: 9.4 mg/dL (ref 8.4–10.5)
Creatinine, Ser: 0.9 mg/dL (ref 0.4–1.5)
GFR: 119.21 mL/min (ref >60.00)
GLUCOSE: 90 mg/dL (ref 70–99)
POTASSIUM: 3.9 meq/L (ref 3.5–5.1)
SODIUM: 139 meq/L (ref 135–145)

## 2014-10-23 LAB — HEMOGLOBIN A1C: Hgb A1c MFr Bld: 5.4 % (ref 4.6–6.5)

## 2014-10-23 MED ORDER — SUMATRIPTAN SUCCINATE 25 MG PO TABS: 25.0000 mg | ORAL_TABLET | ORAL | Status: DC | PRN

## 2014-10-23 MED ORDER — VERAPAMIL HCL ER 120 MG PO CP24: 120.0000 mg | ORAL_CAPSULE | Freq: Two times a day (BID) | ORAL | Status: DC

## 2014-10-23 NOTE — Assessment & Plan Note (Signed)
Check lipid panel, hemoglobin A1c, basic metabolic panel.

## 2014-10-23 NOTE — Assessment & Plan Note (Signed)
We will start verapamil 120 mg twice a day for prevention of cluster headaches. Will also give prescription for sumatriptan 25 mg #10 no refills to be used as abortive therapy. Advised that he quit smoking. Encouraged his alcohol cessation and given information about cluster headaches.

## 2014-10-23 NOTE — Assessment & Plan Note (Signed)
He is currently trying to quit. Talked to him about tips for success.

## 2014-10-23 NOTE — Progress Notes (Signed)
Pre visit review using our clinic review tool, if applicable. No additional management support is needed unless otherwise documented below in the visit note. 

## 2014-10-23 NOTE — Assessment & Plan Note (Signed)
Finished antibiotics and fully resolved.

## 2014-10-23 NOTE — Progress Notes (Signed)
   Subjective:    Patient ID: Paul Prince, male    DOB: Oct 11, 1976, 38 y.o.   MRN: [id number]  HPI The patient is a 38 year old man who comes in today to establish care. His past medical history of rectal abscess within the last 6 months, tobacco abuse. He also has been struggling with cluster headaches for the past couple of years off and on. The last several months he's had quite a few cluster headaches. He notices that smoking set them off, alcohol sets them off. He has given up alcohol altogether he can avoid headaches. He states that he gets a familiar sensation when he starts to have the headache on the left side of his face and in his nose and then proceeds to have his one-sided headache which is excruciating in nature lasting anywhere from 30 minutes to an hour. He states that if he takes a BC powder when he gets that initial sensation sometimes he can avoid the large headache however mostly he has no good treatment.  Review of Systems  Constitutional: Negative for fever, activity change, appetite change, fatigue and unexpected weight change.  Respiratory: Negative for cough, chest tightness, shortness of breath and wheezing.   Cardiovascular: Negative for chest pain, palpitations and leg swelling.  Gastrointestinal: Negative for nausea, abdominal pain, diarrhea, constipation and abdominal distention.  Musculoskeletal: Negative.   Neurological: Positive for headaches. Negative for dizziness, seizures, weakness, light-headedness and numbness.  Psychiatric/Behavioral: Negative.       Objective:   Physical Exam  Constitutional: He is oriented to person, place, and time. He appears well-developed and well-nourished. No distress.  HENT:  Head: Normocephalic and atraumatic.  Eyes: EOM are normal.  Neck: Normal range of motion.  Cardiovascular: Normal rate and regular rhythm.   Pulmonary/Chest: Effort normal and breath sounds normal. No respiratory distress. He has no wheezes. He has no  rales.  Abdominal: Soft. Bowel sounds are normal. He exhibits no distension. There is no tenderness. There is no rebound.  Neurological: He is alert and oriented to person, place, and time. Coordination normal.  Skin: Skin is warm and dry.   Filed Vitals:   10/23/14 1529  BP: 148/82  Pulse: 80  Temp: 98.6 F (37 C)  TempSrc: Oral  Resp: 14  Height: 6' (1.829 m)  Weight: 205 lb (92.987 kg)  SpO2: 96%      Assessment & Plan:

## 2014-10-23 NOTE — Patient Instructions (Signed)
We will have you start taking a medicine called verapamil to help you have less headaches. Take 1 pill twice a day. It may take up to 2 weeks until you notice the full benefit of this medicine.  The other medicine we have sent and is called sumatriptan which is a medicine you can take if you start thinking her going to have a headache to help abort the headache. If the medicine is too strong you can cut it in half and take a half when he noticed you are going to have a headache.  We will check some blood work today and call you back with the results of that to make sure there is nothing they could be contributing to your headaches.  Cluster Headache Cluster headaches are recognized by their pattern of deep, intense head pain. They normally occur on one side of your head, but they may "switch sides" in subsequent episodes. Typically, cluster headaches:   Are severe in nature.   Occur repeatedly over weeks to months and are followed by periods of no headaches.   Can last from 15 minutes to 3 hours.   Occur at the same time each day, often at night.   Occur several times a day. CAUSES The exact cause of cluster headaches is not known. Alcohol use may be associated with cluster headaches. SIGNS AND SYMPTOMS   Severe pain that begins in or around your eye or temple.   One-sided head pain.   Feeling sick to your stomach (nauseous).   Sensitivity to light.   Runny nose.   Eye redness, tearing, and nasal stuffiness on the side of your head where you are experiencing pain.   Sweaty, pale skin of the face.   Droopy or swollen eyelid.   Restlessness. DIAGNOSIS  Cluster headaches are diagnosed based on symptoms and a physical exam. Your health care provider may order a CT scan or an MRI of your head or lab tests to see if your headaches are caused by other medical conditions.  TREATMENT   Medicines for pain relief and to prevent recurrent attacks. Some people may need a  combination of medicines.  Oxygen for pain relief.   Biofeedback programs to help reduce headache pain.  It may be helpful to keep a headache diary. This may help you find a trend for what is triggering your headaches. Your health care provider can develop a treatment plan.  HOME CARE INSTRUCTIONS  During cluster periods:   Follow a regular sleep schedule. Do not vary the amount and time that you sleep from day to day. It is important to stay on the same schedule during a cluster period to help prevent headaches.   Avoid alcohol.   Stop smoking if you smoke.  SEEK MEDICAL CARE IF:  You have any changes from your previous cluster headaches either in intensity or frequency.   You are not getting relief from medicines you are taking.  SEEK IMMEDIATE MEDICAL CARE IF:   You faint.   You have weakness or numbness, especially on one side of your body or face.   You have double vision.   You have nausea or vomiting that is not relieved within several hours.   You cannot keep your balance or have difficulty talking or walking.   You have neck pain or stiffness.   You have a fever. MAKE SURE YOU:  Understand these instructions.   Will watch your condition.   Will get help right away if you are  not doing well or get worse. Document Released: 11/30/2005 Document Revised: 09/20/2013 Document Reviewed: 06/22/2013 [facility] Patient Information 2015 [employer], [place]. This information is not intended to replace advice given to you by your health care provider. Make sure you discuss any questions you have with your health care provider.

## 2015-01-28 ENCOUNTER — Ambulatory Visit: Payer: Managed Care, Other (non HMO) | Admitting: Internal Medicine

## 2015-06-13 ENCOUNTER — Encounter: Payer: Self-pay | Admitting: Internal Medicine

## 2015-06-13 ENCOUNTER — Ambulatory Visit (INDEPENDENT_AMBULATORY_CARE_PROVIDER_SITE_OTHER): Payer: 59 | Admitting: Internal Medicine

## 2015-06-13 VITALS — BP 138/86 | HR 74 | Temp 98.6°F | Ht 72.0 in | Wt 207.0 lb

## 2015-06-13 DIAGNOSIS — L0591 Pilonidal cyst without abscess: Secondary | ICD-10-CM | POA: Diagnosis not present

## 2015-06-13 DIAGNOSIS — G44019 Episodic cluster headache, not intractable: Secondary | ICD-10-CM | POA: Diagnosis not present

## 2015-06-13 DIAGNOSIS — R040 Epistaxis: Secondary | ICD-10-CM | POA: Diagnosis not present

## 2015-06-13 MED ORDER — DOXYCYCLINE HYCLATE 100 MG PO TABS: 100.0000 mg | ORAL_TABLET | Freq: Two times a day (BID) | ORAL | Status: DC

## 2015-06-13 MED ORDER — METHYLPREDNISOLONE ACETATE 80 MG/ML IJ SUSP
80.0000 mg | Freq: Once | INTRAMUSCULAR | Status: AC
  Administered 2015-06-13: 80 mg via INTRAMUSCULAR

## 2015-06-13 MED ORDER — PREDNISONE 10 MG PO TABS: ORAL_TABLET | ORAL | Status: DC

## 2015-06-13 NOTE — Progress Notes (Signed)
Pre visit review using our clinic review tool, if applicable. No additional management support is needed unless otherwise documented below in the visit note. 

## 2015-06-13 NOTE — Patient Instructions (Signed)
You had the steroid shot today  Please take all new medication as prescribed - the prednisone and the antibiotic  Please continue all other medications as before, and refills have been done if requested.  Please have the pharmacy call with any other refills you may need.  Please keep your appointments with your specialists as you may have planned  You will be contacted regarding the referral for: neurology, and general surgury

## 2015-06-13 NOTE — Assessment & Plan Note (Signed)
One episode, likely coincidental to HA, no recurrence, no other overt bleeding/bruising, ok to follow for now, consider ENT for recurrent

## 2015-06-13 NOTE — Assessment & Plan Note (Signed)
With small abscess and cont'd drainage, for doxy course, gen surgury referral

## 2015-06-13 NOTE — Assessment & Plan Note (Signed)
Recurring, ok to cont imitrex but cannot take while driving per pt, for neurology referral per pt request

## 2015-06-13 NOTE — Progress Notes (Signed)
Subjective:    Patient ID: Paul Prince, male    DOB: March 09, 1976, 39 y.o.   MRN: [id number]  HPI  Here to f/u, with another episode of typical cluster HA, left sided, icepick behind the eye assoc with eye tearing, and some better with one of his daughters prednisone it seems.  Has been recurring problem ongoing for 10 yrs.  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  Has tried home o2 but did not find that helpful.  Also with an episode last PM of right sided nosebleed lasting 2 min only, no pain and no recurrence. No other sinus pain, pressure, d/c, fever, ST , cough and Pt denies chest pain, increased sob or doe, wheezing, orthopnea, PND, increased LE swelling, palpitations, dizziness or syncope.  Pt denies new neurological symptoms such as facial or extremity weakness or numbness .  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  Past Medical History  Diagnosis Date  . Chronic headache    Past Surgical History  Procedure Laterality Date  . Examination under anesthesia N/A 08/11/2014    Procedure: EXAM UNDER ANESTHESIA;  Surgeon: Liz MaladyBurke E Thompson, MD;  [facility];  Service: General;  Laterality: N/A;  . Incision and drainage perirectal abscess  08/11/2014    Procedure: IRRIGATION AND DEBRIDEMENT PERIRECTAL ABSCESS;  Surgeon: Liz MaladyBurke E Thompson, MD;  Location: [facility] OR;  Service: General;;    reports that he has been smoking.  He does not have any smokeless tobacco history on file. He reports that he drinks alcohol. He reports that he does not use illicit drugs. family history is not on file. No Known Allergies Current Outpatient Prescriptions on File Prior to Visit  Medication Sig Dispense Refill  . Aspirin-Acetaminophen-Caffeine (GOODY HEADACHE PO) Take by mouth 2 (two) times daily.    . Feverfew 380 MG CAPS Take by mouth 2 (two) times daily.    . Riboflavin (VITAMIN B-2 PO) Take by mouth 2 (two)  times daily.    . SUMAtriptan (IMITREX) 25 MG tablet Take 1 tablet (25 mg total) by mouth every 2 (two) hours as needed for migraine or headache. 10 tablet 0  . verapamil (VERELAN PM) 120 MG 24 hr capsule Take 1 capsule (120 mg total) by mouth 2 (two) times daily. 60 capsule 2   No current facility-administered medications on file prior to visit.   Review of Systems  Constitutional: Negative for unusual diaphoresis or night sweats HENT: Negative for ringing in ear or discharge Eyes: Negative for double vision or worsening visual disturbance.  Respiratory: Negative for choking and stridor.   Gastrointestinal: Negative for vomiting or other signifcant bowel change Genitourinary: Negative for hematuria or change in urine volume.  Musculoskeletal: Negative for other MSK pain or swelling Skin: Negative for color change and worsening wound.  Neurological: Negative for tremors and numbness other than noted  Psychiatric/Behavioral: Negative for decreased concentration or agitation other than above       Objective:   Physical Exam BP 138/86 mmHg  Pulse 74  Temp(Src) 98.6 F (37 C) (Oral)  Ht 6' (1.829 m)  Wt 207 lb (93.895 kg)  BMI 28.07 kg/m2  SpO2 99% VS noted, not ill appearing Constitutional: Pt appears in no significant distress HENT: Head: NCAT.  Right Ear: External ear normal.  Left Ear: External ear normal Bilat tm's with no erythema.  Max sinus areas non tender.  Pharynx with mild erythema, no exudate.  No nasal tenderness or swelling, no overt bleeding Eyes: . Pupils are equal, round, and reactive to light. Conjunctivae and EOM are normal Neck: Normal range of motion. Neck supple.  Cardiovascular: Normal rate and regular rhythm.   Pulmonary/Chest: Effort normal and breath sounds without rales or wheezing.  Abd:  Soft, NT, ND, + BS Natal cleft with 1 cm area swelling and d/c, tender/erythema with mild drainage Neurological: Pt is alert. Not confused , motor grossly  intact Skin: Skin is warm. No rash, no LE edema Psychiatric: Pt behavior is normal. No agitation.     Assessment & Plan:

## 2015-07-22 ENCOUNTER — Encounter: Payer: Self-pay | Admitting: Neurology

## 2015-07-22 ENCOUNTER — Ambulatory Visit (INDEPENDENT_AMBULATORY_CARE_PROVIDER_SITE_OTHER): Payer: 59 | Admitting: Neurology

## 2015-07-22 VITALS — BP 128/84 | HR 78 | Ht 72.0 in | Wt 203.0 lb

## 2015-07-22 DIAGNOSIS — G44019 Episodic cluster headache, not intractable: Secondary | ICD-10-CM | POA: Diagnosis not present

## 2015-07-22 DIAGNOSIS — Z720 Tobacco use: Secondary | ICD-10-CM | POA: Diagnosis not present

## 2015-07-22 MED ORDER — TOPIRAMATE 25 MG PO TABS: 25.0000 mg | ORAL_TABLET | Freq: Two times a day (BID) | ORAL | Status: DC

## 2015-07-22 MED ORDER — SUMATRIPTAN 20 MG/ACT NA SOLN: NASAL | Status: DC

## 2015-07-22 NOTE — Progress Notes (Signed)
NEUROLOGY CONSULTATION NOTE  Paul Prince MRN: [id number] DOB: [birth year]  Referring provider: Dr. Dorise Hiss Primary care provider: Dr. Dorise Hiss  Reason for consult:  Headache  HISTORY OF PRESENT ILLNESS: Paul Prince is a 39 year old right-handed male who presents for cluster headache.  Some history obtained from PCP notes.  Onset:  over 10 years Location:  left-sided, starting from side of nose to behind left eye and into jaw Quality:  Starts as throbbing and increases to ice-pick Intensity:  10/10 Aura:  no Prodrome:  none Associated symptoms:  left ptosis, tearing and conjunctival injection.  Nausea, sometimes vomiting.  Photophobia and phonophobia.  Duration:  15-20 minutes if takes Goodys powder in time.  Up to 3-4 hours. Frequency:  Occurs daily for periods of up to 6 months with pain-free periods in between lasting 2 months.  Occurs different times of day including in middle of night. Triggers/exacerbating factors:  Alcohol, smoking, stress, strong smells Relieving factors:  none Activity:  Restless He hasn't had a headache in about 2 weeks since receiving prednisone taper.  Past abortive medication:  Tried O2 (not a prescription, but breathed in canister bought at a store; ineffective), sumatriptan  po (ineffective), prednisone taper (effective) Past preventative medication:  Verapamil  twice daily (ineffective) Other past therapy:  riboflavin twice daily, feverfew  twice daily  Current abortive medication:  Goodys Antihypertensive medications:  none Antidepressant medications:  none Anticonvulsant medications:  none Vitamins/Herbal/Supplements:  none  Smokes cigarettes.  PAST MEDICAL HISTORY: Past Medical History  Diagnosis Date  . Chronic headache     PAST SURGICAL HISTORY: Past Surgical History  Procedure Laterality Date  . Examination under anesthesia N/A 08/11/2014    Procedure: EXAM UNDER ANESTHESIA;  Surgeon: Liz Malady, MD;   Location: [facility] OR;  Service: General;  Laterality: N/A;  . Incision and drainage perirectal abscess  08/11/2014    Procedure: IRRIGATION AND DEBRIDEMENT PERIRECTAL ABSCESS;  Surgeon: Liz Malady, MD;  Location: [facility] OR;  Service: General;;    MEDICATIONS: Current Outpatient Prescriptions on File Prior to Visit  Medication Sig Dispense Refill  . Aspirin-Acetaminophen-Caffeine (GOODY HEADACHE PO) Take by mouth 2 (two) times daily.     No current facility-administered medications on file prior to visit.    ALLERGIES: No Known Allergies  FAMILY HISTORY: Family History  Problem Relation Age of Onset  . Bipolar disorder Mother   . Heart attack Father     SOCIAL HISTORY: History   Social History  . Marital Status: Legally Separated    Spouse Name: N/A  . Number of Children: N/A  . Years of Education: N/A   Occupational History  . Not on file.   Social History Main Topics  . Smoking status: Current Every Day Smoker  . Smokeless tobacco: Not on file     Comment: black and mild cigars (3-4 daily)  . Alcohol Use: 0.0 oz/week    0 Standard drinks or equivalent per week     Comment: weekends  . Drug Use: No  . Sexual Activity: Not on file   Other Topics Concern  . Not on file   Social History Narrative    REVIEW OF SYSTEMS: Constitutional: No fevers, chills, or sweats, no generalized fatigue, change in appetite Eyes: No visual changes, double vision, eye pain Ear, nose and throat: No hearing loss, ear pain, nasal congestion, sore throat Cardiovascular: No chest pain, palpitations Respiratory:  No shortness of breath at rest or with exertion, wheezes GastrointestinaI: No nausea, vomiting,  diarrhea, abdominal pain, fecal incontinence Genitourinary:  No dysuria, urinary retention or frequency Musculoskeletal:  No neck pain, back pain Integumentary: No rash, pruritus, skin lesions Neurological: as above Psychiatric: No depression, insomnia, anxiety Endocrine: No  palpitations, fatigue, diaphoresis, mood swings, change in appetite, change in weight, increased thirst Hematologic/Lymphatic:  No anemia, purpura, petechiae. Allergic/Immunologic: no itchy/runny eyes, nasal congestion, recent allergic reactions, rashes  PHYSICAL EXAM: Filed Vitals:   07/22/15 1234  BP: 128/84  Pulse: 78   General: No acute distress.  Patient appears well-groomed.  normal body habitus. Head:  Normocephalic/atraumatic Eyes:  fundi unremarkable, without vessel changes, exudates, hemorrhages or papilledema. Neck: supple, no paraspinal tenderness, full range of motion Back: No paraspinal tenderness Heart: regular rate and rhythm Lungs: Clear to auscultation bilaterally. Vascular: No carotid bruits. Neurological Exam: Mental status: alert and oriented to person, place, and time, recent and remote memory intact, fund of knowledge intact, attention and concentration intact, speech fluent and not dysarthric, language intact. Cranial nerves: CN I: not tested CN II: pupils equal, round and reactive to light, visual fields intact, fundi unremarkable, without vessel changes, exudates, hemorrhages or papilledema. CN III, IV, VI:  full range of motion, no nystagmus, no ptosis CN V: facial sensation intact CN VII: upper and lower face symmetric CN VIII: hearing intact CN IX, X: gag intact, uvula midline CN XI: sternocleidomastoid and trapezius muscles intact CN XII: tongue midline Bulk & Tone: normal, no fasciculations. Motor:  5/5 throughout Sensation:  Temperature and vibration intact Deep Tendon Reflexes:  2+ throughout, toes downgoing. Finger to nose testing:  No dysmetria Heel to shin:  No dysmetria Gait:  Normal station and stride.  Able to turn and walk in tandem. Romberg negative.  IMPRESSION: Semiology does correlate with cluster headache (although it typically wouldn't last longer than 3 hours) Tobacco abuse  PLAN: 1.  Will start topiramate 25mg  twice daily.   Side effects discussed.  He will call in 4 weeks with update. 2.  Sumatriptan 20mg  nasal prescribed as abortive therapy.  Advised to limit Goodys to no more than 2 days out of the week 3.  Discussed smoking cessation 4.  Follow up in 3 months.  45 minutes spent with patient, over 50% spent discussing diagnosis and management.  Thank you for allowing me to take part in the care of this patient.  Shon Millet, DO  CC:  Genella Mech, MD

## 2015-07-22 NOTE — Patient Instructions (Signed)
1.  We will start topiramate (Topamax)  twice daily.  Possible side effects include: impaired thinking, sedation, paresthesias (numbness and tingling) and weight loss.  It may cause dehydration and there is a small risk for kidney stones, so make sure to stay hydrated with water during the day.  There is also a very small risk for glaucoma, so if you notice any change in your vision while taking this medication, see an ophthalmologist.  2.  At earliest onset of headache, take sumatriptan  nasal spray.  Spray once in nose at earliest onset of headache.  May repeat once in 2 hours if needed.  Do not exceed 2 sprays in 24 hours 3.  Avoid alcohol and smoking 4.  Call in 4 weeks with update.  Follow up in 3 months.

## 2015-08-15 ENCOUNTER — Telehealth: Payer: Self-pay | Admitting: *Deleted

## 2015-08-15 MED ORDER — PREDNISONE 10 MG PO TABS: ORAL_TABLET | ORAL | Status: DC

## 2015-08-15 NOTE — Telephone Encounter (Signed)
Prednisone done erx 

## 2015-08-15 NOTE — Telephone Encounter (Signed)
Left msg on triage stating requesting a refill on the prednisone md gave him. Keep having these chronic cluster headaches. He has seen specialist but they are no help, using me as a ginny pig, and their method is not helping....Raechel Chute

## 2015-08-15 NOTE — Telephone Encounter (Signed)
Notified pt md ok refill has been sent to [facility]...Raechel Chute

## 2015-09-26 ENCOUNTER — Other Ambulatory Visit: Payer: Self-pay | Admitting: Internal Medicine

## 2015-10-01 ENCOUNTER — Telehealth: Payer: Self-pay | Admitting: Internal Medicine

## 2015-10-01 NOTE — Telephone Encounter (Signed)
Patient states he is also requesting refill on antibiotic.  Please advise.

## 2015-10-01 NOTE — Telephone Encounter (Signed)
Pt is also requesting refill on his prednisone

## 2015-10-02 NOTE — Telephone Encounter (Signed)
Called pt inform him request for prednisone has been denied will need to make f/u appt. Made appt for tomorrow with Dr. [name]...Raechel Chute/lmb

## 2015-10-03 ENCOUNTER — Encounter: Payer: Self-pay | Admitting: Internal Medicine

## 2015-10-03 ENCOUNTER — Ambulatory Visit (INDEPENDENT_AMBULATORY_CARE_PROVIDER_SITE_OTHER): Payer: 59 | Admitting: Internal Medicine

## 2015-10-03 VITALS — BP 160/80 | HR 77 | Temp 99.0°F | Resp 14 | Ht 72.0 in | Wt 206.0 lb

## 2015-10-03 DIAGNOSIS — G44019 Episodic cluster headache, not intractable: Secondary | ICD-10-CM | POA: Diagnosis not present

## 2015-10-03 DIAGNOSIS — L0591 Pilonidal cyst without abscess: Secondary | ICD-10-CM | POA: Diagnosis not present

## 2015-10-03 MED ORDER — PREDNISONE 10 MG PO TABS: ORAL_TABLET | ORAL | Status: DC

## 2015-10-03 MED ORDER — SULFAMETHOXAZOLE-TRIMETHOPRIM 800-160 MG PO TABS: 1.0000 | ORAL_TABLET | Freq: Two times a day (BID) | ORAL | Status: DC

## 2015-10-03 NOTE — Patient Instructions (Signed)
We have refilled the prednisone for the headaches. We have sent in bactrim for the cyst, take 1 pill twice a day for 2 weeks.   We will get you in with a surgeon about removing the cyst for good so you won't have more trouble with it.   As far as protein supplements you can look for whey protein and any brand is okay to help you build muscles with the lifting.

## 2015-10-03 NOTE — Progress Notes (Signed)
Pre visit review using our clinic review tool, if applicable. No additional management support is needed unless otherwise documented below in the visit note. 

## 2015-10-04 NOTE — Assessment & Plan Note (Signed)
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

## 2015-10-04 NOTE — Assessment & Plan Note (Signed)
Okay with another course of prednisone while he tries to quit smoking but talked to him about the fact that this is not a long term solution as you cannot take prednisone for a long time without getting side effects from that with bones. He agrees to try something else if the headaches return again.

## 2015-10-04 NOTE — Progress Notes (Signed)
   Subjective:    Patient ID: Paul Prince, male    DOB: 1976/06/04, 39 y.o.   MRN: [id number]  HPI The patient is a 39 YO man coming in with recurrent cluster headaches. They are severe when they strike and keep him from working. He has done well with course of prednisone in the past and generally keep him headache free for 1-2 months. Is still smoking and working on stopping. No aura with the headache and behind his eyes predominantly. Has been tried on nasal sumatriptan which was not helpful and advised about topamax for prevention which he did not try.  Next problem is a recurrent cyst on his gluteal crest. 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 Builds up then drains out. Always comes back at some point. Is painful and bothersome to him. Did some antibiotics recently which were partially helpful.   Review of Systems  Constitutional: Negative for fever, activity change, appetite change, fatigue and unexpected weight change.  Respiratory: Negative for cough, chest tightness, shortness of breath and wheezing.   Cardiovascular: Negative for chest pain, palpitations and leg swelling.  Gastrointestinal: Negative for nausea, abdominal pain, diarrhea, constipation and abdominal distention.  Musculoskeletal: Negative.   Neurological: Positive for headaches. Negative for dizziness, seizures, weakness, light-headedness and numbness.  Psychiatric/Behavioral: Negative.       Objective:   Physical Exam  Constitutional: He is oriented to person, place, and time. He appears well-developed and well-nourished. No distress.  HENT:  Head: Normocephalic and atraumatic.  Eyes: EOM are normal.  Neck: Normal range of motion.  Cardiovascular: Normal rate and regular rhythm.   Pulmonary/Chest: Effort normal and breath sounds normal. No respiratory distress. He has no wheezes. He has no rales.  Abdominal: Soft. Bowel sounds are normal. He exhibits no distension. There is no tenderness. There is no  rebound.  Neurological: He is alert and oriented to person, place, and time. Coordination normal.  Skin: Skin is warm and dry.  Small cyst at the gluteal crest, inflamed and hard, no skin cellulitis.    Filed Vitals:   10/03/15 1537  BP: 160/80  Pulse: 77  Temp: 99 F (37.2 C)  TempSrc: Oral  Resp: 14  Height: 6' (1.829 m)  Weight: 206 lb (93.441 kg)  SpO2: 99%      Assessment & Plan:

## 2015-10-23 ENCOUNTER — Ambulatory Visit: Payer: 59 | Admitting: Neurology

## 2016-01-01 ENCOUNTER — Telehealth: Payer: Self-pay | Admitting: Internal Medicine

## 2016-01-01 NOTE — Telephone Encounter (Signed)
Pt called in and needs refill on his predniSONE (DELTASONE) 10 MG tablet [[id number]]

## 2016-01-02 NOTE — Telephone Encounter (Signed)
This should not be refilled as prednisone is not a long term solution for his headaches. He would need a visit for his headaches.

## 2016-01-02 NOTE — Telephone Encounter (Signed)
Please schedule an acute visit for headaches, thanks.

## 2016-01-03 ENCOUNTER — Ambulatory Visit (INDEPENDENT_AMBULATORY_CARE_PROVIDER_SITE_OTHER): Payer: 59 | Admitting: Internal Medicine

## 2016-01-03 ENCOUNTER — Encounter: Payer: Self-pay | Admitting: Internal Medicine

## 2016-01-03 VITALS — BP 116/80 | HR 76 | Temp 98.2°F | Resp 16 | Wt 200.0 lb

## 2016-01-03 DIAGNOSIS — G44019 Episodic cluster headache, not intractable: Secondary | ICD-10-CM

## 2016-01-03 MED ORDER — PREDNISONE 10 MG PO TABS: ORAL_TABLET | ORAL | Status: DC

## 2016-01-03 NOTE — Progress Notes (Signed)
Subjective:    Patient ID: Paul Prince, male    DOB: 1976-03-04, 40 y.o.   MRN: [id number]  HPI He is here for his cluster headaches.   He has 3-4 a day.  Stress, alcohol are both triggers.  They sometimes wake him up at night.  The pain is on the left side of his head.  He takes at least 4-5 Goody powders a day and that is the only thing that works.  If he does not catch it in time he can be down and out for hours.  His symptoms often start in his left sinus.   He gets weakness in his left eye, watery left eye, his left side of face feels weak, blurry vision and nausea.    He saw a neurologist.  He was given Topamax and it did not help.  He stopped taking it.  He has been prescribed prednisone in th past and it did help.    He tried imitrex, oxygen therapy and neither helped.    Medications and allergies reviewed with patient and updated if appropriate.  Patient Active Problem List   Diagnosis Date Noted  . Episodic cluster headache, not intractable 07/22/2015  . Pilonidal cyst 06/13/2015  . Epistaxis 06/13/2015  . Routine general medical examination at a health care facility 10/23/2014  . Tobacco abuse 10/23/2014  . Cluster headache 10/23/2014    Current Outpatient Prescriptions on File Prior to Visit  Medication Sig Dispense Refill  . Aspirin-Acetaminophen-Caffeine (GOODY HEADACHE PO) Take by mouth 2 (two) times daily.    . predniSONE (DELTASONE) 10 MG tablet 3 tabs by mouth per day for 3 days,2tabs per day for 3 days,1tab per day for 3 days (Patient not taking: Reported on 01/03/2016) 18 tablet 3   No current facility-administered medications on file prior to visit.    Past Medical History  Diagnosis Date  . Chronic headache     Past Surgical History  Procedure Laterality Date  . Examination under anesthesia N/A 08/11/2014    Procedure: EXAM UNDER ANESTHESIA;  Surgeon: Liz Malady, MD;  Location: [facility] OR;  Service: General;  Laterality: N/A;  . Incision and  drainage perirectal abscess  08/11/2014    Procedure: IRRIGATION AND DEBRIDEMENT PERIRECTAL ABSCESS;  Surgeon: Liz Malady, MD;  Location: [facility] OR;  Service: General;;    Social History   Social History  . Marital Status: Legally Separated    Spouse Name: N/A  . Number of Children: N/A  . Years of Education: N/A   Social History Main Topics  . Smoking status: Current Every Day Smoker  . Smokeless tobacco: None     Comment: black and mild cigars (3-4 daily)  . Alcohol Use: 0.0 oz/week    0 Standard drinks or equivalent per week     Comment: weekends  . Drug Use: No  . Sexual Activity: Not Asked   Other Topics Concern  . None   Social History Narrative    Family History  Problem Relation Age of Onset  . Bipolar disorder Mother   . Heart attack Father     Review of Systems See HPI    Objective:   Filed Vitals:   01/03/16 1400  BP: 116/80  Pulse: 76  Temp: 98.2 F (36.8 C)  Resp: 16   Filed Weights   01/03/16 1400  Weight: 200 lb (90.719 kg)   Body mass index is 27.12 kg/(m^2).   Physical Exam  Constitutional: He appears well-developed and  well-nourished.  HENT:  Head: Normocephalic and atraumatic.  Eyes: Conjunctivae are normal.  Cardiovascular: Normal rate, regular rhythm and normal heart sounds.   No murmur heard. Pulmonary/Chest: Effort normal and breath sounds normal. No respiratory distress. He has no wheezes. He has no rales.  Musculoskeletal: He exhibits no edema.  Neurological: No cranial nerve deficit.      Assessment & Plan:   See Problem List for Assessment and Plan

## 2016-01-03 NOTE — Assessment & Plan Note (Signed)
Will prescribe another round of steroid - no refills given Referral to neuro Stressed that he needs a preventive med, not abortive med since he is having them daily Avoid too many goody powders

## 2016-01-03 NOTE — Progress Notes (Signed)
Pre visit review using our clinic review tool, if applicable. No additional management support is needed unless otherwise documented below in the visit note. 

## 2016-01-03 NOTE — Patient Instructions (Addendum)
A prescription for prednisone was sent to your pharmacy for the cluster headaches.  Try to avoid too much Goody powder.   A referral was ordered for neurology.  Follow up as needed with Dr. Okey Dupre.

## 2016-01-24 ENCOUNTER — Ambulatory Visit (INDEPENDENT_AMBULATORY_CARE_PROVIDER_SITE_OTHER): Payer: 59 | Admitting: Diagnostic Neuroimaging

## 2016-01-24 ENCOUNTER — Encounter: Payer: Self-pay | Admitting: Diagnostic Neuroimaging

## 2016-01-24 VITALS — BP 131/83 | HR 74 | Ht 72.0 in | Wt 203.2 lb

## 2016-01-24 DIAGNOSIS — G44019 Episodic cluster headache, not intractable: Secondary | ICD-10-CM

## 2016-01-24 MED ORDER — SUMATRIPTAN SUCCINATE 6 MG/0.5ML ~~LOC~~ SOLN: 6.0000 mg | SUBCUTANEOUS | Status: DC | PRN

## 2016-01-24 MED ORDER — VERAPAMIL HCL 120 MG PO TABS: 120.0000 mg | ORAL_TABLET | Freq: Three times a day (TID) | ORAL | Status: DC

## 2016-01-24 MED ORDER — TOPIRAMATE 50 MG PO TABS: 50.0000 mg | ORAL_TABLET | Freq: Two times a day (BID) | ORAL | Status: DC

## 2016-01-24 NOTE — Progress Notes (Signed)
[facility]  PATIENT: Paul Prince DOB: 1976-09-15  REFERRING CLINICIAN: Burns HISTORY FROM: patient  REASON FOR VISIT: new consult    HISTORICAL  CHIEF COMPLAINT:  Chief Complaint  Patient presents with  . Headache    rm 6, New Patient, "having severe cluster HA 4-5 daily > 10 yrs possibly due to injury on job to left cheek"    HISTORY OF PRESENT ILLNESS:   40 year old right-handed male here for evaluation of cluster headaches. Patient had onset of headaches in 2007 following a work-related injury. Patient was on a work site when a guardrail was dislodged, spun around and struck him in the face. Patient had severe injury including severe facial laceration. He went to local emergency room, had suture repair of his laceration, observation and then discharged. He had to take one week off of work and then was able to return.  Soon after patient had onset of intermittent severe 10 out of 10 headaches involving his left nostril, nose, teeth, face and eye. He describes severe stabbing burning boring pain on left side. Sometimes associated with nausea, agitation and anxiety. Sometimes this is associated with severe whole-body tremors. Sometimes has pins and needle sensation and scalp. Certain smells can trigger headaches. Sometimes eating and then going to sleep immediately can trigger headache. Even 1 bottle of beer can trigger severe headaches. Sometimes he has 6 months of severe headaches on multiple per day ASIS, other times he goes several months without them. In the last few years he is having at least 3-4 of the severe headaches per day. He typically uses Goody powder within a few minutes of onset and sometimes is able to prevent headaches from getting full-blown. In spite of this headaches will last 30 minutes. If he does not take medication early that headaches may last 2-3 hours.  Patient is tried right medications from PCP and another neurologist including  prednisone, topiramate, oxygen therapy, sumatriptan nasal spray, sumatriptan and oral tablet, verapamil, all without significant relief.    REVIEW OF SYSTEMS: Full 14 system review of systems performed and notable only for eye pain headache not enough sleep averaging 2-3 hours per night, decreased energy.  ALLERGIES: No Known Allergies  HOME MEDICATIONS: Outpatient Prescriptions Prior to Visit  Medication Sig Dispense Refill  . Aspirin-Acetaminophen-Caffeine (GOODY HEADACHE PO) Take by mouth 2 (two) times daily.    . predniSONE (DELTASONE) 10 MG tablet 3 tabs by mouth per day for 3 days,2tabs per day for 3 days,1tab per day for 3 days 18 tablet 0   No facility-administered medications prior to visit.    PAST MEDICAL HISTORY: Past Medical History  Diagnosis Date  . Chronic headache     PAST SURGICAL HISTORY: Past Surgical History  Procedure Laterality Date  . Examination under anesthesia N/A 08/11/2014    Procedure: EXAM UNDER ANESTHESIA;  Surgeon: Liz Malady, MD;  Location: [facility] OR;  Service: General;  Laterality: N/A;  . Incision and drainage perirectal abscess  08/11/2014    Procedure: IRRIGATION AND DEBRIDEMENT PERIRECTAL ABSCESS;  Surgeon: Liz Malady, MD;  Location: [facility] OR;  Service: General;;    FAMILY HISTORY: Family History  Problem Relation Age of Onset  . Bipolar disorder Mother   . Heart attack Father     SOCIAL HISTORY:  Social History   Social History  . Marital Status: Legally Separated    Spouse Name: N/A  . Number of Children: 6  . Years of Education: 11   Occupational History  .  truck driver, Cytogeneticist   Social History Main Topics  . Smoking status: Current Every Day Smoker -- 1.00 packs/day for 20 years  . Smokeless tobacco: Not on file     Comment: 01/24/16 black and mild cigars (3-4 daily)  . Alcohol Use: 0.0 oz/week    0 Standard drinks or equivalent per week     Comment: weekends  . Drug Use: No  . Sexual  Activity: Not on file   Other Topics Concern  . Not on file   Social History Narrative   Lives alone   Caffeine use- none     PHYSICAL EXAM  GENERAL EXAM/CONSTITUTIONAL: Vitals:  Filed Vitals:   01/24/16 1105  BP: 131/83  Pulse: 74  Height: 6' (1.829 m)  Weight: 203 lb 3.2 oz (92.171 kg)     Body mass index is 27.55 kg/(m^2).  No exam data present  Patient is in no distress; well developed, nourished and groomed; neck is supple  CARDIOVASCULAR:  Examination of carotid arteries is normal; no carotid bruits  Regular rate and rhythm, no murmurs  Examination of peripheral vascular system by observation and palpation is normal  EYES:  Ophthalmoscopic exam of optic discs and posterior segments is normal; no papilledema or hemorrhages  MUSCULOSKELETAL:  Gait, strength, tone, movements noted in Neurologic exam below  NEUROLOGIC: MENTAL STATUS:  No flowsheet data found.  awake, alert, oriented to person, place and time  recent and remote memory intact  normal attention and concentration  language fluent, comprehension intact, naming intact,   fund of knowledge appropriate  CRANIAL NERVE:   2nd - no papilledema on fundoscopic exam  2nd, 3rd, 4th, 6th - pupils equal and reactive to light, visual fields full to confrontation, extraocular muscles intact, no nystagmus  5th - facial sensation symmetric  7th - facial strength symmetric  8th - hearing intact  9th - palate elevates symmetrically, uvula midline  11th - shoulder shrug symmetric  12th - tongue protrusion midline  MOTOR:   normal bulk and tone, full strength in the BUE, BLE  SENSORY:   normal and symmetric to light touch, pinprick, temperature, vibration  COORDINATION:   finger-nose-finger, fine finger movements normal  REFLEXES:   deep tendon reflexes present and symmetric  GAIT/STATION:   narrow based gait; able to walk tandem; romberg is negative    DIAGNOSTIC DATA  (LABS, IMAGING, TESTING) - I reviewed patient records, labs, notes, testing and imaging myself where available.  Lab Results  Component Value Date   WBC 10.5 08/13/2014   HGB 10.6* 08/13/2014   HCT 30.5* 08/13/2014   MCV 88.2 08/13/2014   PLT 264 08/13/2014      Component Value Date/Time   NA 139 10/23/2014 1609   K 3.9 10/23/2014 1609   CL 104 10/23/2014 1609   CO2 30 10/23/2014 1609   GLUCOSE 90 10/23/2014 1609   BUN 14 10/23/2014 1609   CREATININE 0.9 10/23/2014 1609   CALCIUM 9.4 10/23/2014 1609   GFRNONAA >90 08/10/2014 2155   GFRAA >90 08/10/2014 2155   Lab Results  Component Value Date   CHOL 170 10/23/2014   HDL 33.90* 10/23/2014   LDLCALC 113* 10/23/2014   TRIG 117.0 10/23/2014   CHOLHDL 5 10/23/2014   Lab Results  Component Value Date   HGBA1C 5.4 10/23/2014   No results found for: VITAMINB12 No results found for: TSH   02/25/09 CT head [I reviewed images myself and agree with interpretation. -VRP]  - No acute intracranial findings  or mass lesions.     ASSESSMENT AND PLAN  40 y.o. 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 Now patient having multiple severe headaches per day. He has tried multiple individual medications in the past without relief.     Dx: Episodic cluster headache, not intractable - Plan: Ambulatory referral to Home Health    PLAN: - Now we will try combination preventative therapy including verapamil, topiramate, melatonin; will try sumatriptan injection and home oxygen therapy for acute treatment.  We could consider long-term prednisone therapy as patient seems to be responding to this medication, however long-term corticosteroid side effects are not favorable and patient acknowledges this. - Discussed surgical treatment options such as DBS, VNS, or SPG stimulation; will hold off for now as patient has not yet tried combination therapy yet  Orders Placed This  Encounter  Procedures  . Ambulatory referral to Home Health   Meds ordered this encounter  Medications  . verapamil (CALAN) 120 MG tablet    Sig: Take 1 tablet (120 mg total) by mouth 3 (three) times daily.    Dispense:  90 tablet    Refill:  6  . topiramate (TOPAMAX) 50 MG tablet    Sig: Take 1 tablet (50 mg total) by mouth 2 (two) times daily.    Dispense:  60 tablet    Refill:  6  . SUMAtriptan (IMITREX) 6 MG/0.5ML SOLN injection    Sig: Inject 0.5 mLs (6 mg total) into the skin as needed (cluster headache).    Dispense:  6 vial    Refill:  6   Return in about 1 month (around 02/21/2016).    Suanne Marker, MD 01/24/2016, 12:31 PM Certified in Neurology, Neurophysiology and Neuroimaging  [facility] [address], [address] [place], [place] [address] [phone]

## 2016-01-24 NOTE — Patient Instructions (Signed)
Thank you for coming to see [place] at [facility]. I hope we have been able to provide you high quality care today.  You may receive a patient satisfaction survey over the next few weeks. We would appreciate your feedback and comments so that we may continue to improve ourselves and the health of our patients.  - start verapamil 136m daily; increase up to three times per day - start topiramate 524mat bedtime; increase up to twice a day - start melatonin at bedtime - use oxygen for cluster headache attacks (1551mtes x 100% via mask) - use sumatriptan injection for cluster headache attacks   ~~~~~~~~~~~~~~~~~~~~~~~~~~~~~~~~~~~~~~~~~~~~~~~~~~~~~~~~~~~~~~~~~  DR. Dayleen Beske'S GUIDE TO HAPPY AND HEALTHY LIVING These are some of my general health and wellness recommendations. Some of them may apply to you better than others. Please use common sense as you try these suggestions and feel free to ask me any questions.   ACTIVITY/FITNESS Mental, social, emotional and physical stimulation are very important for brain and body health. Try learning a new activity (arts, music, language, sports, games).  Keep moving your body to the best of your abilities. You can do this at home, inside or outside, the park, community center, gym or anywhere you like. Consider a physical therapist or personal trainer to get started. Consider the app Sworkit. Fitness trackers such as smart-watches, smart-phones or Fitbits can help as well.   NUTRITION Eat more plants: colorful vegetables, nuts, seeds and berries.  Eat less sugar, salt, preservatives and processed foods.  Avoid toxins such as cigarettes and alcohol.  Drink water when you are thirsty. Warm water with a slice of lemon is an excellent morning drink to start the day.  Consider these websites for more information The Nutrition Source (htthttps://www.henry-hernandez.biz/recision Nutrition  (wwwWindowBlog.ch RELAXATION Consider practicing mindfulness meditation or other relaxation techniques such as deep breathing, prayer, yoga, tai chi, massage. See website mindful.org or the apps Headspace or Calm to help get started.   SLEEP Try to get at least 7-8+ hours sleep per day. Regular exercise and reduced caffeine will help you sleep better. Practice good sleep hygeine techniques. See website sleep.org for more information.   PLANNING Prepare estate planning, living will, healthcare POA documents. Sometimes this is best planned with the help of an attorney. Theconversationproject.org and agingwithdignity.org are excellent resources.

## 2016-03-06 ENCOUNTER — Ambulatory Visit: Payer: 59 | Admitting: Diagnostic Neuroimaging

## 2016-03-09 ENCOUNTER — Telehealth: Payer: Self-pay | Admitting: Internal Medicine

## 2016-03-09 NOTE — Telephone Encounter (Signed)
Tried to reach patient to inform. No answer, left message on voice mail.

## 2016-03-09 NOTE — Telephone Encounter (Signed)
Is requesting refill on prednisone.

## 2016-03-09 NOTE — Telephone Encounter (Signed)
No, this is not a medicine that should be refilled.

## 2016-05-04 ENCOUNTER — Telehealth: Payer: Self-pay | Admitting: Neurology

## 2016-05-04 ENCOUNTER — Encounter (HOSPITAL_COMMUNITY): Payer: Self-pay | Admitting: *Deleted

## 2016-05-04 ENCOUNTER — Encounter: Payer: Self-pay | Admitting: Neurology

## 2016-05-04 ENCOUNTER — Emergency Department (HOSPITAL_COMMUNITY)
Admission: EM | Admit: 2016-05-04 | Discharge: 2016-05-04 | Disposition: A | Discharge status: Home / Self Care | Payer: 59 | Attending: Emergency Medicine | Admitting: Emergency Medicine

## 2016-05-04 ENCOUNTER — Ambulatory Visit (INDEPENDENT_AMBULATORY_CARE_PROVIDER_SITE_OTHER): Payer: Managed Care, Other (non HMO) | Admitting: Neurology

## 2016-05-04 VITALS — BP 140/86 | HR 91 | Ht 72.0 in | Wt 197.2 lb

## 2016-05-04 DIAGNOSIS — G44009 Cluster headache syndrome, unspecified, not intractable: Secondary | ICD-10-CM | POA: Diagnosis not present

## 2016-05-04 DIAGNOSIS — G8929 Other chronic pain: Secondary | ICD-10-CM | POA: Insufficient documentation

## 2016-05-04 DIAGNOSIS — F17200 Nicotine dependence, unspecified, uncomplicated: Secondary | ICD-10-CM

## 2016-05-04 DIAGNOSIS — R51 Headache: Secondary | ICD-10-CM | POA: Insufficient documentation

## 2016-05-04 DIAGNOSIS — Z79899 Other long term (current) drug therapy: Secondary | ICD-10-CM

## 2016-05-04 MED ORDER — PREDNISONE 10 MG PO TABS: ORAL_TABLET | ORAL | Status: DC

## 2016-05-04 MED ORDER — SUMATRIPTAN SUCCINATE 6 MG/0.5ML ~~LOC~~ SOLN: SUBCUTANEOUS | Status: DC

## 2016-05-04 MED ORDER — VERAPAMIL HCL 80 MG PO TABS: 80.0000 mg | ORAL_TABLET | Freq: Three times a day (TID) | ORAL | Status: DC

## 2016-05-04 NOTE — ED Notes (Signed)
Pt c/o headache tonight, similar to his cluster headaches he has had previously. Took goody powder without relief.

## 2016-05-04 NOTE — Telephone Encounter (Signed)
Called and spoke with patient. Stated that he needed a P.A. Done for Imitrex injection. Will initiate P.A. Via Covermymeds.com

## 2016-05-04 NOTE — Patient Instructions (Addendum)
1.  Continue topiramate 50mg  twice daily 2.  Start verapamil 80mg  three times daily.  I want you to go to Dr. Frutoso [place]'s office for an EKG.  I gave you enough for 2 weeks.  Contact us at that time and we can continue dose or increase. 3.  Prednisone taper.  Take 6 tablets daily for 3 days, then 5 tablets daily for 3 days, then 4 tablets daily for 3 days, then 3 tablets daily for 3 days, then 2 tablets daily for 3 days, then 1 tablet daily for 3 days and then stop. 4.  We will order home O2 for you. 5.  Follow up in next 6 weeks

## 2016-05-04 NOTE — Telephone Encounter (Signed)
Paul Prince. He was seen this morning. He went to [facility] to have some prescriptions filled and the Verapamil he may can get at another store. He also was not able to get the Vimotrax due to exceeding the limit. [facility] number is [phone]. The cost would be $400.00. His number is [phone] and [phone].  He would like you to call him please. Thank you

## 2016-05-04 NOTE — Progress Notes (Signed)
NEUROLOGY FOLLOW UP OFFICE NOTE  Paul Prince [id number]  HISTORY OF PRESENT ILLNESS: Paul Prince is a 40 year old right-handed male, smoker, who follows up for cluster headache.   UPDATE: I haven't seen Mr. Bannan since August.  Since then, he has seen another neurologist who prescribed him medication, which he never picked up (verapamil, sumatriptan  Davison, O2). Headaches have been severe that he went to the ED this morning but left because he was just waiting too long.  He is tearful. Current abortive medication:  Goody, Sumatriptan  NS (ineffective) Antihypertensive medications:  none Antidepressant medications:  none Anticonvulsant medications:  topiramate  twice daily Vitamins/Herbal/Supplements:  none  HISTORY: Onset:  over 10 years Location:  left-sided, starting from side of nose to behind left eye and into jaw Quality:  Starts as throbbing and increases to ice-pick Initial Intensity:  10/10 Aura:  no Prodrome:  none Associated symptoms:  left ptosis, tearing and conjunctival injection.  Nausea, sometimes vomiting.  Photophobia and phonophobia.   Initial Duration:  15-20 minutes if takes Goodys powder in time.  Up to 3-4 hours. Initial Frequency:  Occurs daily for periods of up to 6 months with pain-free periods in between lasting 2 months (usually worse during spring and autumn).  Occurs different times of day including in middle of night. Triggers/exacerbating factors:  Alcohol, smoking, stress, strong smells Relieving factors:  none Activity:  Restless He hasn't had a headache in about 2 weeks since receiving prednisone taper.  Past abortive medication:  Tried O2 (not a prescription, but breathed in canister bought at a store; ineffective), sumatriptan  po (ineffective), prednisone taper (effective), Marlin Canary Past preventative medication:  Verapamil  twice daily (ineffective) Other past therapy:  riboflavin twice daily, feverfew  twice  daily  PAST MEDICAL HISTORY: Past Medical History  Diagnosis Date  . Chronic headache     MEDICATIONS: Current Outpatient Prescriptions on File Prior to Visit  Medication Sig Dispense Refill  . Aspirin-Acetaminophen-Caffeine (GOODY HEADACHE PO) Take by mouth 2 (two) times daily.    Marland Kitchen topiramate (TOPAMAX) 50 MG tablet Take 1 tablet (50 mg total) by mouth 2 (two) times daily. (Patient not taking: Reported on 05/04/2016) 60 tablet 6   No current facility-administered medications on file prior to visit.    ALLERGIES: No Known Allergies  FAMILY HISTORY: Family History  Problem Relation Age of Onset  . Bipolar disorder Mother   . Heart attack Father     SOCIAL HISTORY: Social History   Social History  . Marital Status: Legally Separated    Spouse Name: N/A  . Number of Children: 6  . Years of Education: 11   Occupational History  .      truck driver, Cytogeneticist   Social History Main Topics  . Smoking status: Current Every Day Smoker -- 1.00 packs/day for 20 years  . Smokeless tobacco: Not on file     Comment: 01/24/16 black and mild cigars (3-4 daily)  . Alcohol Use: 0.0 oz/week    0 Standard drinks or equivalent per week     Comment: weekends  . Drug Use: No  . Sexual Activity: Not on file   Other Topics Concern  . Not on file   Social History Narrative   Lives alone   Caffeine use- none    REVIEW OF SYSTEMS: Constitutional: No fevers, chills, or sweats, no generalized fatigue, change in appetite Eyes: No visual changes, double vision, eye pain Ear, nose and throat: No hearing loss,  ear pain, nasal congestion, sore throat Cardiovascular: No chest pain, palpitations Respiratory:  No shortness of breath at rest or with exertion, wheezes GastrointestinaI: No nausea, vomiting, diarrhea, abdominal pain, fecal incontinence Genitourinary:  No dysuria, urinary retention or frequency Musculoskeletal:  No neck pain, back pain Integumentary: No rash,  pruritus, skin lesions Neurological: as above Psychiatric: No depression, insomnia, anxiety Endocrine: No palpitations, fatigue, diaphoresis, mood swings, change in appetite, change in weight, increased thirst Hematologic/Lymphatic:  No purpura, petechiae. Allergic/Immunologic: no itchy/runny eyes, nasal congestion, recent allergic reactions, rashes  PHYSICAL EXAM: Filed Vitals:   05/04/16 0924  BP: 140/86  Pulse: 91   General: Tearful, discomfort.  Patient appears well-groomed.  normal body habitus. Head:  Normocephalic/atraumatic Eyes:  Fundi examined but not visualized Neck: supple, no paraspinal tenderness, full range of motion Heart:  Regular rate and rhythm Lungs:  Clear to auscultation bilaterally Back: No paraspinal tenderness Neurological Exam: alert and oriented to person, place, and time. Attention span and concentration intact, recent and remote memory intact, fund of knowledge intact.  Speech fluent and not dysarthric, language intact.  CN II-XII intact. Bulk and tone normal, muscle strength 5/5 throughout.  Sensation to light touch, temperature and vibration intact.  Deep tendon reflexes 2+ throughout.  Finger to nose and heel to shin testing intact.  Gait normal  IMPRESSION: Cluster headache Tobacco use  PLAN: 1.  He will continue topiramate 50mg  twice daily 2.  He was previously on low effective dose of verapamil in the past.  We will restart it at 80mg  three times daily.  In 2 weeks, he will contact us with update and we can increase dose by 80mg  daily.  I want to check a baseline EKG as well. 3.  For abortive therapy, will make sure he picks up sumatriptan 6mg  Anselmo and home O2. 4.  Prednisone taper 5.  The other neurologist discussed surgical options, which may be considered next visit.  We also discussed continuity of care and that he should continue care with just one neurologist.  It is up to him whom he wishes to see.  At this point, he would like to continue with  me. 6.  Follow up in 6 weeks but contact us in 2 weeks. 7.  Smoking cessation  15 minutes spent face to face with patient, over 50% spent discussing management.  Shon MilletAdam Golda Zavalza, DO  CC: Hillard DankerElizabeth Crawford, MD

## 2016-05-04 NOTE — ED Notes (Signed)
Pt left due to wait time  

## 2016-05-06 ENCOUNTER — Ambulatory Visit (INDEPENDENT_AMBULATORY_CARE_PROVIDER_SITE_OTHER): Payer: Managed Care, Other (non HMO) | Admitting: Internal Medicine

## 2016-05-06 ENCOUNTER — Encounter: Payer: Self-pay | Admitting: Internal Medicine

## 2016-05-06 ENCOUNTER — Other Ambulatory Visit (INDEPENDENT_AMBULATORY_CARE_PROVIDER_SITE_OTHER): Payer: Managed Care, Other (non HMO)

## 2016-05-06 VITALS — BP 130/72 | HR 79 | Wt 197.0 lb

## 2016-05-06 DIAGNOSIS — R739 Hyperglycemia, unspecified: Secondary | ICD-10-CM

## 2016-05-06 DIAGNOSIS — G44019 Episodic cluster headache, not intractable: Secondary | ICD-10-CM

## 2016-05-06 DIAGNOSIS — Z79899 Other long term (current) drug therapy: Secondary | ICD-10-CM

## 2016-05-06 DIAGNOSIS — Z720 Tobacco use: Secondary | ICD-10-CM

## 2016-05-06 LAB — HEPATIC FUNCTION PANEL
ALBUMIN: 4.7 g/dL (ref 3.5–5.2)
ALT: 15 U/L (ref 0–53)
AST: 12 U/L (ref 0–37)
Alkaline Phosphatase: 59 U/L (ref 39–117)
BILIRUBIN DIRECT: 0.1 mg/dL (ref 0.0–0.3)
TOTAL PROTEIN: 6.9 g/dL (ref 6.0–8.3)
Total Bilirubin: 0.3 mg/dL (ref 0.2–1.2)

## 2016-05-06 LAB — LIPID PANEL
CHOL/HDL RATIO: 4
Cholesterol: 180 mg/dL (ref 0–200)
HDL: 40.8 mg/dL (ref >39.00)
LDL CALC: 129 mg/dL — AB (ref 0–99)
NONHDL: 139.42
Triglycerides: 52 mg/dL (ref 0.0–149.0)
VLDL: 10.4 mg/dL (ref 0.0–40.0)

## 2016-05-06 LAB — CBC WITH DIFFERENTIAL/PLATELET
BASOS ABS: 0 10*3/uL (ref 0.0–0.1)
Basophils Relative: 0.1 % (ref 0.0–3.0)
Eosinophils Absolute: 0 10*3/uL (ref 0.0–0.7)
Eosinophils Relative: 0.4 % (ref 0.0–5.0)
HCT: 39.3 % (ref 39.0–52.0)
Hemoglobin: 12.9 g/dL — ABNORMAL LOW (ref 13.0–17.0)
LYMPHS ABS: 1.1 10*3/uL (ref 0.7–4.0)
Lymphocytes Relative: 10.7 % — ABNORMAL LOW (ref 12.0–46.0)
MCHC: 32.7 g/dL (ref 30.0–36.0)
MCV: 93.6 fl (ref 78.0–100.0)
MONOS PCT: 3.2 % (ref 3.0–12.0)
Monocytes Absolute: 0.3 10*3/uL (ref 0.1–1.0)
NEUTROS ABS: 8.8 10*3/uL — AB (ref 1.4–7.7)
NEUTROS PCT: 85.6 % — AB (ref 43.0–77.0)
PLATELETS: 275 10*3/uL (ref 150.0–400.0)
RBC: 4.2 Mil/uL — ABNORMAL LOW (ref 4.22–5.81)
RDW: 15.2 % (ref 11.5–15.5)
WBC: 10.3 10*3/uL (ref 4.0–10.5)

## 2016-05-06 LAB — VITAMIN D 25 HYDROXY (VIT D DEFICIENCY, FRACTURES): VITD: 14.67 ng/mL — ABNORMAL LOW (ref 30.00–100.00)

## 2016-05-06 LAB — BASIC METABOLIC PANEL
BUN: 15 mg/dL (ref 6–23)
CALCIUM: 9.6 mg/dL (ref 8.4–10.5)
CO2: 29 meq/L (ref 19–32)
CREATININE: 0.84 mg/dL (ref 0.40–1.50)
Chloride: 108 mEq/L (ref 96–112)
GFR: 129.73 mL/min (ref >60.00)
GLUCOSE: 103 mg/dL — AB (ref 70–99)
Potassium: 4.1 mEq/L (ref 3.5–5.1)
SODIUM: 141 meq/L (ref 135–145)

## 2016-05-06 LAB — HEMOGLOBIN A1C: Hgb A1c MFr Bld: 6.1 % (ref 4.6–6.5)

## 2016-05-06 LAB — VITAMIN B12: VITAMIN B 12: 395 pg/mL (ref 211–911)

## 2016-05-06 LAB — TSH: TSH: 0.4 u[IU]/mL (ref 0.35–4.50)

## 2016-05-06 LAB — SEDIMENTATION RATE: Sed Rate: 4 mm/hr (ref 0–15)

## 2016-05-06 MED ORDER — ERGOCALCIFEROL 1.25 MG (50000 UT) PO CAPS: 50000.0000 [IU] | ORAL_CAPSULE | ORAL | Status: DC

## 2016-05-06 MED ORDER — VITAMIN D3 50 MCG (2000 UT) PO CAPS: 2000.0000 [IU] | ORAL_CAPSULE | Freq: Every day | ORAL | Status: DC

## 2016-05-06 NOTE — Assessment & Plan Note (Signed)
Discussed.

## 2016-05-06 NOTE — Assessment & Plan Note (Addendum)
Dr Everlena CooperJaffe Very severe HAs. Currently better on Prednisone Off work note was provided O2, Imitrex EKG ok

## 2016-05-06 NOTE — Progress Notes (Signed)
Pre visit review using our clinic review tool, if applicable. No additional management support is needed unless otherwise documented below in the visit note. 

## 2016-05-06 NOTE — Progress Notes (Signed)
Subjective:  Patient ID: Paul Prince, male    DOB: [birth year]  Age: 40 y.o. MRN: [id number]  CC: No chief complaint on file.   HPI Paul Prince presents for extremely severe cluster HAs. He has been unable to work x 1 week  Outpatient Prescriptions Prior to Visit  Medication Sig Dispense Refill  . Aspirin-Acetaminophen-Caffeine (GOODY HEADACHE PO) Take by mouth 2 (two) times daily.    . predniSONE (DELTASONE) 10 MG tablet Take 6 tabs daily x3 d, then 5 tabs daily x3d, then 4 tabs daily x3d, then 3 tabs daily x3d, then 2 tabs daily x3d, then 1 tab daily x3d, then STOP 63 tablet 0  . SUMAtriptan (IMITREX) 6 MG/0.5ML SOLN injection Inject once at earliest onset of headache.  May repeat once in 1 hour if needed.  Do not exceed 2 injections in 24 hours. 6 vial 6  . topiramate (TOPAMAX) 50 MG tablet Take 1 tablet (50 mg total) by mouth 2 (two) times daily. 60 tablet 6  . verapamil (CALAN) 80 MG tablet Take 1 tablet (80 mg total) by mouth 3 (three) times daily. 45 tablet 0   No facility-administered medications prior to visit.    ROS Review of Systems  Constitutional: Negative for appetite change, fatigue and unexpected weight change.  HENT: Negative for congestion, nosebleeds, sneezing, sore throat and trouble swallowing.   Eyes: Negative for itching and visual disturbance.  Respiratory: Negative for cough.   Cardiovascular: Negative for chest pain, palpitations and leg swelling.  Gastrointestinal: Negative for nausea, diarrhea, blood in stool and abdominal distention.  Genitourinary: Negative for frequency and hematuria.  Musculoskeletal: Negative for back pain, joint swelling, gait problem and neck pain.  Skin: Negative for rash.  Neurological: Positive for headaches. Negative for dizziness, tremors, speech difficulty and weakness.  Psychiatric/Behavioral: Negative for suicidal ideas, sleep disturbance, dysphoric mood and agitation. The patient is not nervous/anxious.      Objective:  BP 130/72 mmHg  Pulse 79  Wt 197 lb (89.359 kg)  SpO2 98%  BP Readings from Last 3 Encounters:  05/06/16 130/72  05/04/16 140/86  05/04/16 139/95    Wt Readings from Last 3 Encounters:  05/06/16 197 lb (89.359 kg)  05/04/16 197 lb 3.2 oz (89.449 kg)  05/04/16 198 lb (89.812 kg)    Physical Exam  Constitutional: He is oriented to person, place, and time. He appears well-developed. No distress.  NAD  HENT:  Mouth/Throat: Oropharynx is clear and moist.  Eyes: Conjunctivae are normal. Pupils are equal, round, and reactive to light.  Neck: Normal range of motion. No JVD present. No thyromegaly present.  Cardiovascular: Normal rate, regular rhythm, normal heart sounds and intact distal pulses.  Exam reveals no gallop and no friction rub.   No murmur heard. Pulmonary/Chest: Effort normal and breath sounds normal. No respiratory distress. He has no wheezes. He has no rales. He exhibits no tenderness.  Abdominal: Soft. Bowel sounds are normal. He exhibits no distension and no mass. There is no tenderness. There is no rebound and no guarding.  Musculoskeletal: Normal range of motion. He exhibits no edema or tenderness.  Lymphadenopathy:    He has no cervical adenopathy.  Neurological: He is alert and oriented to person, place, and time. He has normal reflexes. No cranial nerve deficit. He exhibits normal muscle tone. He displays a negative Romberg sign. Coordination and gait normal.  Skin: Skin is warm and dry. No rash noted.  Psychiatric: He has a normal mood and affect. His behavior  is normal. Judgment and thought content normal.     Procedure: EKG Indication: HAs requested by Dr Everlena CooperJaffe Impression: NSR. No acute changes.  Lab Results  Component Value Date   WBC 10.5 08/13/2014   HGB 10.6* 08/13/2014   HCT 30.5* 08/13/2014   PLT 264 08/13/2014   GLUCOSE 90 10/23/2014   CHOL 170 10/23/2014   TRIG 117.0 10/23/2014   HDL 33.90* 10/23/2014   LDLCALC 113*  10/23/2014   NA 139 10/23/2014   K 3.9 10/23/2014   CL 104 10/23/2014   CREATININE 0.9 10/23/2014   BUN 14 10/23/2014   CO2 30 10/23/2014   INR 1.13 08/13/2014   HGBA1C 5.4 10/23/2014    No results found.  Assessment & Plan:   Diagnoses and all orders for this visit:  Medication management -     EKG 12-Lead  Other orders -     Cancel: EKG 12-Lead   I am having Paul Prince maintain his Aspirin-Acetaminophen-Caffeine (GOODY HEADACHE PO), topiramate, verapamil, predniSONE, SUMAtriptan, and naproxen.  Meds ordered this encounter  Medications  . naproxen (NAPROSYN) 500 MG tablet    Sig: as needed. Reported on 05/06/2016    Refill:  0     Follow-up: No Follow-up on file.  Sonda PrimesAlex Rumi Kolodziej, MD

## 2016-05-14 ENCOUNTER — Telehealth: Payer: Self-pay

## 2016-05-14 MED ORDER — VERAPAMIL HCL 80 MG PO TABS: 80.0000 mg | ORAL_TABLET | Freq: Three times a day (TID) | ORAL | Status: DC

## 2016-05-14 NOTE — Telephone Encounter (Signed)
I reviewed the EKG performed on 05/06/16.  It looks okay.  No further recommendations.

## 2016-05-14 NOTE — Telephone Encounter (Signed)
It has been two weeks since pt was seen. Per A&P, "Start verapamil 80mg  three times daily. I want you to go to Dr. Frutoso Chaserawford's office for an EKG. I gave you enough for 2 weeks. Contact us at that time and we can continue dose or increase." Did you see results of ECG? It was reordered under another provider, so unsure if were forwarded results.

## 2016-05-14 NOTE — Telephone Encounter (Signed)
Spoke with patient. States that he has not had a migraine/headache since completing the prednisone. States he has had some that started but he was able to stop them before they progressed. Pt is having no side effects on the Verapamil 80 mg T.I.D. Will send in a full rx.

## 2016-06-15 ENCOUNTER — Ambulatory Visit: Payer: Managed Care, Other (non HMO) | Admitting: Neurology

## 2016-06-15 DIAGNOSIS — Z029 Encounter for administrative examinations, unspecified: Secondary | ICD-10-CM

## 2016-07-22 ENCOUNTER — Ambulatory Visit (INDEPENDENT_AMBULATORY_CARE_PROVIDER_SITE_OTHER): Payer: Managed Care, Other (non HMO) | Admitting: Internal Medicine

## 2016-07-22 VITALS — BP 136/80 | HR 75 | Temp 97.5°F | Resp 16 | Wt 205.0 lb

## 2016-07-22 DIAGNOSIS — G44019 Episodic cluster headache, not intractable: Secondary | ICD-10-CM | POA: Diagnosis not present

## 2016-07-22 DIAGNOSIS — L259 Unspecified contact dermatitis, unspecified cause: Secondary | ICD-10-CM

## 2016-07-22 MED ORDER — PREDNISONE 10 MG PO TABS: ORAL_TABLET | ORAL | 0 refills | Status: DC

## 2016-07-22 NOTE — Progress Notes (Signed)
Subjective:    Patient ID: Paul Prince, male    DOB: Dec 16, 1975, 40 y.o.   MRN: [id number]  HPI Here to f/u with recurring HA's; Has seen neurology, and Has been on prednisone and verapamil with less frequent HA's; recently has missed a neuro f/u appt .  Then last night with persistent cluster hard banging pain for over 8 hrs last night; oxygen did not help b/c did not start soon enough he thinks.  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  Has had even suicidal thoughts in the past due to the pain , none in the past weeks.  Appt with neuro is not until about aug 23; has decided he will prob be ok with doing the trigeminal nerve surgury (nerve ablation) Has tried verapamil, sumatriptan pill, prednisone, and had toradol that only helped for one day  Still having flashes of pain.  Last prednisone over 6 mo ago.    Also rash to bilat forearms after working with hoses at work that touched him, very itchy, then slight raised itchy bumps to both arms. , without fever Past Medical History:  Diagnosis Date  . Chronic headache    Past Surgical History:  Procedure Laterality Date  . EXAMINATION UNDER ANESTHESIA N/A 08/11/2014   Procedure: EXAM UNDER ANESTHESIA;  Surgeon: Liz MaladyBurke E Thompson, MD;  [facility];  Service: General;  Laterality: N/A;  . INCISION AND DRAINAGE PERIRECTAL ABSCESS  08/11/2014   Procedure: IRRIGATION AND DEBRIDEMENT PERIRECTAL ABSCESS;  Surgeon: Liz MaladyBurke E Thompson, MD;  Location: [facility] OR;  Service: General;;    reports that he has been smoking.  He has a 20.00 pack-year smoking history. He does not have any smokeless tobacco history on file. He reports that he drinks  alcohol. He reports that he does not use drugs. family history includes Bipolar disorder in his mother; Heart attack in his father. No Known Allergies Current Outpatient Prescriptions on File Prior to Visit  Medication Sig Dispense Refill  . Aspirin-Acetaminophen-Caffeine (GOODY HEADACHE PO) Take by mouth 2 (two) times daily.    . Cholecalciferol (VITAMIN D3) 2000 units capsule Take 1 capsule (2,000 Units total) by mouth daily. 100 capsule 3  . ergocalciferol (VITAMIN D2) 50000 units capsule Take 1 capsule (50,000 Units total) by mouth once a week. 8 capsule 0  . naproxen (NAPROSYN) 500 MG tablet as needed. Reported on 05/06/2016  0  . predniSONE (DELTASONE) 10 MG tablet Take 6 tabs daily x3 d, then 5 tabs daily x3d, then 4 tabs daily x3d, then 3 tabs daily x3d, then 2 tabs daily x3d, then 1 tab daily x3d, then STOP 63 tablet 0  . SUMAtriptan (IMITREX) 6 MG/0.5ML SOLN injection Inject once at earliest onset of headache.  May repeat once in 1 hour if needed.  Do not exceed 2 injections in 24 hours. 6 vial 6  . topiramate (TOPAMAX) 50 MG tablet Take 1 tablet (50 mg total) by mouth 2 (two) times daily. 60 tablet 6  . verapamil (CALAN) 80 MG tablet Take 1 tablet (80 mg total) by mouth 3 (three) times daily. 90 tablet 0   No current  facility-administered medications on file prior to visit.    Review of Systems  Constitutional: Negative for unusual diaphoresis or night sweats HENT: Negative for ear swelling or discharge Eyes: Negative for worsening visual haziness  Respiratory: Negative for choking and stridor.   Gastrointestinal: Negative for distension or worsening eructation Genitourinary: Negative for retention or change in urine volume.  Musculoskeletal: Negative for other MSK pain or swelling Skin: Negative for color change and worsening wound Neurological: Negative for tremors and numbness other than noted  Psychiatric/Behavioral: Negative for decreased concentration or agitation other than  above       Objective:   Physical Exam BP 136/80   Pulse 75   Temp 97.5 F (36.4 C) (Oral)   Resp 16   Wt 205 lb (93 kg)   SpO2 98%   BMI 27.80 kg/m  VS noted,  Constitutional: Pt appears in no apparent distress HENT: Head: NCAT.  Right Ear: External ear normal.  Left Ear: External ear normal.  Eyes: . Pupils are equal, round, and reactive to light. Conjunctivae and EOM are normal Neck: Normal range of motion. Neck supple.  Cardiovascular: Normal rate and regular rhythm.   Pulmonary/Chest: Effort normal and breath sounds without rales or wheezing.  Abd:  Soft, NT, ND, + BS Neurological: Pt is alert. Not confused , motor 5/5 intact, sens intact to LT, dtr intact Skin: Skin is warm. No rash, no LE edema Psychiatric: Pt behavior is normal. No agitation.     Assessment & Plan:

## 2016-07-22 NOTE — Patient Instructions (Signed)
Please take all new medication as prescribed - the prednisone  Please continue all other medications as before, and refills have been done if requested.  Please have the pharmacy call with any other refills you may need.  Please keep your appointments with your specialists as you may have planned   

## 2016-07-22 NOTE — Progress Notes (Signed)
Pre visit review using our clinic review tool, if applicable. No additional management support is needed unless otherwise documented below in the visit note. 

## 2016-07-28 NOTE — Assessment & Plan Note (Signed)
Mild to mod, for prednisone asd,  to f/u any worsening symptoms or concerns ?

## 2016-07-28 NOTE — Assessment & Plan Note (Signed)
Mild, to arms, should also improve with predpac asd,  to f/u any worsening symptoms or concerns

## 2016-08-03 ENCOUNTER — Encounter: Payer: Self-pay | Admitting: Internal Medicine

## 2016-08-03 ENCOUNTER — Ambulatory Visit (INDEPENDENT_AMBULATORY_CARE_PROVIDER_SITE_OTHER): Payer: Managed Care, Other (non HMO) | Admitting: Internal Medicine

## 2016-08-03 VITALS — BP 150/90 | HR 85 | Temp 98.8°F | Resp 14 | Ht 72.0 in | Wt 206.0 lb

## 2016-08-03 DIAGNOSIS — M545 Low back pain, unspecified: Secondary | ICD-10-CM

## 2016-08-03 DIAGNOSIS — Z23 Encounter for immunization: Secondary | ICD-10-CM

## 2016-08-03 MED ORDER — CYCLOBENZAPRINE HCL 5 MG PO TABS: 5.0000 mg | ORAL_TABLET | Freq: Three times a day (TID) | ORAL | 0 refills | Status: DC | PRN

## 2016-08-03 MED ORDER — DICLOFENAC SODIUM 75 MG PO TBEC: 75.0000 mg | DELAYED_RELEASE_TABLET | Freq: Two times a day (BID) | ORAL | 0 refills | Status: DC

## 2016-08-03 NOTE — Progress Notes (Signed)
   Subjective:    Patient ID: Paul Prince, male    DOB: 04/04/1976, 40 y.o.   MRN: [id number]  HPI The patient is a 40 YO man coming in for back pain. He was lifting a doghouse last Thursday and now has pain. It is in the low back middle. He has not been able to work since that time due to 10/10 pain. He is having the pain radiate into his buttocks. No numbness in his legs or weakness. No fevers or chills. Has tried heat/ice with minimal relief. Tried tylenol and ibuprofen with no relief.   Review of Systems  Constitutional: Positive for activity change. Negative for appetite change, chills, fatigue, fever and unexpected weight change.  Respiratory: Negative.   Cardiovascular: Negative.   Gastrointestinal: Negative.   Musculoskeletal: Positive for arthralgias, back pain, gait problem and myalgias. Negative for neck pain and neck stiffness.  Skin: Negative.   Neurological: Negative for dizziness, weakness and numbness.      Objective:   Physical Exam  Constitutional: He is oriented to person, place, and time. He appears well-developed and well-nourished.  HENT:  Head: Normocephalic and atraumatic.  Eyes: EOM are normal.  Neck: Normal range of motion.  Cardiovascular: Normal rate and regular rhythm.   Pulmonary/Chest: Effort normal and breath sounds normal. No respiratory distress. He has no wheezes. He has no rales.  Abdominal: Soft. Bowel sounds are normal. He exhibits no distension. There is no tenderness. There is no rebound.  Musculoskeletal: He exhibits tenderness.  Tenderness lumbar and sacral region  Neurological: He is alert and oriented to person, place, and time. Coordination abnormal.  Hesitating gait due to pain.   Skin: Skin is warm and dry.   Vitals:   08/03/16 1454  BP: (!) 150/90  Pulse: 85  Resp: 14  Temp: 98.8 F (37.1 C)  TempSrc: Oral  SpO2: 98%  Weight: 206 lb (93.4 kg)  Height: 6' (1.829 m)      Assessment & Plan:  Tdap given at visit.

## 2016-08-03 NOTE — Progress Notes (Signed)
Pre visit review using our clinic review tool, if applicable. No additional management support is needed unless otherwise documented below in the visit note. 

## 2016-08-03 NOTE — Patient Instructions (Addendum)
We have sent in flexeril which is the muscle relaxer. You can take that up to 3 times a day as needed for the pain.   We have also sent in voltaren which is the anti-inflammatory medicine. Take 1 pill twice a day for the next 1-2 weeks to help with the muscles in the back.    Low Back Sprain With Rehab A sprain is an injury in which a ligament is torn. The ligaments of the lower back are vulnerable to sprains. However, they are strong and require great force to be injured. These ligaments are important for stabilizing the spinal column. Sprains are classified into three categories. Grade 1 sprains cause pain, but the tendon is not lengthened. Grade 2 sprains include a lengthened ligament, due to the ligament being stretched or partially ruptured. With grade 2 sprains there is still function, although the function may be decreased. Grade 3 sprains involve a complete tear of the tendon or muscle, and function is usually impaired. SYMPTOMS   Severe pain in the lower back.  Sometimes, a feeling of a "pop," "snap," or tear, at the time of injury.  Tenderness and sometimes swelling at the injury site.  Uncommonly, bruising (contusion) within 48 hours of injury.  Muscle spasms in the back. CAUSES  Low back sprains occur when a force is placed on the ligaments that is greater than they can handle. Common causes of injury include:  Performing a stressful act while off-balance.  Repetitive stressful activities that involve movement of the lower back.  Direct hit (trauma) to the lower back. RISK INCREASES WITH:  Contact sports (football, wrestling).  Collisions (major skiing accidents).  Sports that require throwing or lifting (baseball, weightlifting).  Sports involving twisting of the spine (gymnastics, diving, tennis, golf).  Poor strength and flexibility.  Inadequate protection.  Previous back injury or surgery (especially fusion). PREVENTION  Wear properly fitted and padded  protective equipment.  Warm up and stretch properly before activity.  Allow for adequate recovery between workouts.  Maintain physical fitness:  Strength, flexibility, and endurance.  Cardiovascular fitness.  Maintain a healthy body weight. PROGNOSIS  If treated properly, low back sprains usually heal with non-surgical treatment. The length of time for healing depends on the severity of the injury.  RELATED COMPLICATIONS   Recurring symptoms, resulting in a chronic problem.  Chronic inflammation and pain in the low back.  Delayed healing or resolution of symptoms, especially if activity is resumed too soon.  Prolonged impairment.  Unstable or arthritic joints of the low back. TREATMENT  Treatment first involves the use of ice and medicine, to reduce pain and inflammation. The use of strengthening and stretching exercises may help reduce pain with activity. These exercises may be performed at home or with a therapist. Severe injuries may require referral to a therapist for further evaluation and treatment, such as ultrasound. Your caregiver may advise that you wear a back brace or corset, to help reduce pain and discomfort. Often, prolonged bed rest results in greater harm then benefit. Corticosteroid injections may be recommended. However, these should be reserved for the most serious cases. It is important to avoid using your back when lifting objects. At night, sleep on your back on a firm mattress, with a pillow placed under your knees. If non-surgical treatment is unsuccessful, surgery may be needed.  MEDICATION   If pain medicine is needed, nonsteroidal anti-inflammatory medicines (aspirin and ibuprofen), or other minor pain relievers (acetaminophen), are often advised.  Do not take pain  medicine for 7 days before surgery.  Prescription pain relievers may be given, if your caregiver thinks they are needed. Use only as directed and only as much as you need.  Ointments applied  to the skin may be helpful.  Corticosteroid injections may be given by your caregiver. These injections should be reserved for the most serious cases, because they may only be given a certain number of times. HEAT AND COLD  Cold treatment (icing) should be applied for 10 to 15 minutes every 2 to 3 hours for inflammation and pain, and immediately after activity that aggravates your symptoms. Use ice packs or an ice massage.  Heat treatment may be used before performing stretching and strengthening activities prescribed by your caregiver, physical therapist, or athletic trainer. Use a heat pack or a warm water soak. SEEK MEDICAL CARE IF:   Symptoms get worse or do not improve in 2 to 4 weeks, despite treatment.  You develop numbness or weakness in either leg.  You lose bowel or bladder function.  Any of the following occur after surgery: fever, increased pain, swelling, redness, drainage of fluids, or bleeding in the affected area.  New, unexplained symptoms develop. (Drugs used in treatment may produce side effects.) EXERCISES  RANGE OF MOTION (ROM) AND STRETCHING EXERCISES - Low Back Sprain Most people with lower back pain will find that their symptoms get worse with excessive bending forward (flexion) or arching at the lower back (extension). The exercises that will help resolve your symptoms will focus on the opposite motion.  Your physician, physical therapist or athletic trainer will help you determine which exercises will be most helpful to resolve your lower back pain. Do not complete any exercises without first consulting with your caregiver. Discontinue any exercises which make your symptoms worse, until you speak to your caregiver. If you have pain, numbness or tingling which travels down into your buttocks, leg or foot, the goal of the therapy is for these symptoms to move closer to your back and eventually resolve. Sometimes, these leg symptoms will get better, but your lower back  pain may worsen. This is often an indication of progress in your rehabilitation. Be very alert to any changes in your symptoms and the activities in which you participated in the 24 hours prior to the change. Sharing this information with your caregiver will allow him or her to most efficiently treat your condition. These exercises may help you when beginning to rehabilitate your injury. Your symptoms may resolve with or without further involvement from your physician, physical therapist or athletic trainer. While completing these exercises, remember:   Restoring tissue flexibility helps normal motion to return to the joints. This allows healthier, less painful movement and activity.  An effective stretch should be held for at least 30 seconds.  A stretch should never be painful. You should only feel a gentle lengthening or release in the stretched tissue. FLEXION RANGE OF MOTION AND STRETCHING EXERCISES: STRETCH - Flexion, Single Knee to Chest   Lie on a firm bed or floor with both legs extended in front of you.  Keeping one leg in contact with the floor, bring your opposite knee to your chest. Hold your leg in place by either grabbing behind your thigh or at your knee.  Pull until you feel a gentle stretch in your low back. Hold __________ seconds.  Slowly release your grasp and repeat the exercise with the opposite side. Repeat __________ times. Complete this exercise __________ times per day.  STRETCH -  Flexion, Double Knee to Chest  Lie on a firm bed or floor with both legs extended in front of you.  Keeping one leg in contact with the floor, bring your opposite knee to your chest.  Tense your stomach muscles to support your back and then lift your other knee to your chest. Hold your legs in place by either grabbing behind your thighs or at your knees.  Pull both knees toward your chest until you feel a gentle stretch in your low back. Hold __________ seconds.  Tense your stomach  muscles and slowly return one leg at a time to the floor. Repeat __________ times. Complete this exercise __________ times per day.  STRETCH - Low Trunk Rotation  Lie on a firm bed or floor. Keeping your legs in front of you, bend your knees so they are both pointed toward the ceiling and your feet are flat on the floor.  Extend your arms out to the side. This will stabilize your upper body by keeping your shoulders in contact with the floor.  Gently and slowly drop both knees together to one side until you feel a gentle stretch in your low back. Hold for __________ seconds.  Tense your stomach muscles to support your lower back as you bring your knees back to the starting position. Repeat the exercise to the other side. Repeat __________ times. Complete this exercise __________ times per day  EXTENSION RANGE OF MOTION AND FLEXIBILITY EXERCISES: STRETCH - Extension, Prone on Elbows   Lie on your stomach on the floor, a bed will be too soft. Place your palms about shoulder width apart and at the height of your head.  Place your elbows under your shoulders. If this is too painful, stack pillows under your chest.  Allow your body to relax so that your hips drop lower and make contact more completely with the floor.  Hold this position for __________ seconds.  Slowly return to lying flat on the floor. Repeat __________ times. Complete this exercise __________ times per day.  RANGE OF MOTION - Extension, Prone Press Ups  Lie on your stomach on the floor, a bed will be too soft. Place your palms about shoulder width apart and at the height of your head.  Keeping your back as relaxed as possible, slowly straighten your elbows while keeping your hips on the floor. You may adjust the placement of your hands to maximize your comfort. As you gain motion, your hands will come more underneath your shoulders.  Hold this position __________ seconds.  Slowly return to lying flat on the  floor. Repeat __________ times. Complete this exercise __________ times per day.  RANGE OF MOTION- Quadruped, Neutral Spine   Assume a hands and knees position on a firm surface. Keep your hands under your shoulders and your knees under your hips. You may place padding under your knees for comfort.  Drop your head and point your tailbone toward the ground below you. This will round out your lower back like an angry cat. Hold this position for __________ seconds.  Slowly lift your head and release your tail bone so that your back sags into a large arch, like an old horse.  Hold this position for __________ seconds.  Repeat this until you feel limber in your low back.  Now, find your "sweet spot." This will be the most comfortable position somewhere between the two previous positions. This is your neutral spine. Once you have found this position, tense your stomach muscles to  support your low back.  Hold this position for __________ seconds. Repeat __________ times. Complete this exercise __________ times per day.  STRENGTHENING EXERCISES - Low Back Sprain These exercises may help you when beginning to rehabilitate your injury. These exercises should be done near your "sweet spot." This is the neutral, low-back arch, somewhere between fully rounded and fully arched, that is your least painful position. When performed in this safe range of motion, these exercises can be used for people who have either a flexion or extension based injury. These exercises may resolve your symptoms with or without further involvement from your physician, physical therapist or athletic trainer. While completing these exercises, remember:   Muscles can gain both the endurance and the strength needed for everyday activities through controlled exercises.  Complete these exercises as instructed by your physician, physical therapist or athletic trainer. Increase the resistance and repetitions only as guided.  You may  experience muscle soreness or fatigue, but the pain or discomfort you are trying to eliminate should never worsen during these exercises. If this pain does worsen, stop and make certain you are following the directions exactly. If the pain is still present after adjustments, discontinue the exercise until you can discuss the trouble with your caregiver. STRENGTHENING - Deep Abdominals, Pelvic Tilt   Lie on a firm bed or floor. Keeping your legs in front of you, bend your knees so they are both pointed toward the ceiling and your feet are flat on the floor.  Tense your lower abdominal muscles to press your low back into the floor. This motion will rotate your pelvis so that your tail bone is scooping upwards rather than pointing at your feet or into the floor. With a gentle tension and even breathing, hold this position for __________ seconds. Repeat __________ times. Complete this exercise __________ times per day.  STRENGTHENING - Abdominals, Crunches   Lie on a firm bed or floor. Keeping your legs in front of you, bend your knees so they are both pointed toward the ceiling and your feet are flat on the floor. Cross your arms over your chest.  Slightly tip your chin down without bending your neck.  Tense your abdominals and slowly lift your trunk high enough to just clear your shoulder blades. Lifting higher can put excessive stress on the lower back and does not further strengthen your abdominal muscles.  Control your return to the starting position. Repeat __________ times. Complete this exercise __________ times per day.  STRENGTHENING - Quadruped, Opposite UE/LE Lift   Assume a hands and knees position on a firm surface. Keep your hands under your shoulders and your knees under your hips. You may place padding under your knees for comfort.  Find your neutral spine and gently tense your abdominal muscles so that you can maintain this position. Your shoulders and hips should form a rectangle  that is parallel with the floor and is not twisted.  Keeping your trunk steady, lift your right hand no higher than your shoulder and then your left leg no higher than your hip. Make sure you are not holding your breath. Hold this position for __________ seconds.  Continuing to keep your abdominal muscles tense and your back steady, slowly return to your starting position. Repeat with the opposite arm and leg. Repeat __________ times. Complete this exercise __________ times per day.  STRENGTHENING - Abdominals and Quadriceps, Straight Leg Raise   Lie on a firm bed or floor with both legs extended in front  of you.  Keeping one leg in contact with the floor, bend the other knee so that your foot can rest flat on the floor.  Find your neutral spine, and tense your abdominal muscles to maintain your spinal position throughout the exercise.  Slowly lift your straight leg off the floor about 6 inches for a count of 15, making sure to not hold your breath.  Still keeping your neutral spine, slowly lower your leg all the way to the floor. Repeat this exercise with each leg __________ times. Complete this exercise __________ times per day. POSTURE AND BODY MECHANICS CONSIDERATIONS - Low Back Sprain Keeping correct posture when sitting, standing or completing your activities will reduce the stress put on different body tissues, allowing injured tissues a chance to heal and limiting painful experiences. The following are general guidelines for improved posture. Your physician or physical therapist will provide you with any instructions specific to your needs. While reading these guidelines, remember:  The exercises prescribed by your provider will help you have the flexibility and strength to maintain correct postures.  The correct posture provides the best environment for your joints to work. All of your joints have less wear and tear when properly supported by a spine with good posture. This means you  will experience a healthier, less painful body.  Correct posture must be practiced with all of your activities, especially prolonged sitting and standing. Correct posture is as important when doing repetitive low-stress activities (typing) as it is when doing a single heavy-load activity (lifting). RESTING POSITIONS Consider which positions are most painful for you when choosing a resting position. If you have pain with flexion-based activities (sitting, bending, stooping, squatting), choose a position that allows you to rest in a less flexed posture. You would want to avoid curling into a fetal position on your side. If your pain worsens with extension-based activities (prolonged standing, working overhead), avoid resting in an extended position such as sleeping on your stomach. Most people will find more comfort when they rest with their spine in a more neutral position, neither too rounded nor too arched. Lying on a non-sagging bed on your side with a pillow between your knees, or on your back with a pillow under your knees will often provide some relief. Keep in mind, being in any one position for a prolonged period of time, no matter how correct your posture, can still lead to stiffness. PROPER SITTING POSTURE In order to minimize stress and discomfort on your spine, you must sit with correct posture. Sitting with good posture should be effortless for a healthy body. Returning to good posture is a gradual process. Many people can work toward this most comfortably by using various supports until they have the flexibility and strength to maintain this posture on their own. When sitting with proper posture, your ears will fall over your shoulders and your shoulders will fall over your hips. You should use the back of the chair to support your upper back. Your lower back will be in a neutral position, just slightly arched. You may place a small pillow or folded towel at the base of your lower back for   support.  When working at a desk, create an environment that supports good, upright posture. Without extra support, muscles tire, which leads to excessive strain on joints and other tissues. Keep these recommendations in mind: CHAIR:  A chair should be able to slide under your desk when your back makes contact with the back of the  chair. This allows you to work closely.  The chair's height should allow your eyes to be level with the upper part of your monitor and your hands to be slightly lower than your elbows. BODY POSITION  Your feet should make contact with the floor. If this is not possible, use a foot rest.  Keep your ears over your shoulders. This will reduce stress on your neck and low back. INCORRECT SITTING POSTURES  If you are feeling tired and unable to assume a healthy sitting posture, do not slouch or slump. This puts excessive strain on your back tissues, causing more damage and pain. Healthier options include:  Using more support, like a lumbar pillow.  Switching tasks to something that requires you to be upright or walking.  Talking a brief walk.  Lying down to rest in a neutral-spine position. PROLONGED STANDING WHILE SLIGHTLY LEANING FORWARD  When completing a task that requires you to lean forward while standing in one place for a long time, place either foot up on a stationary 2-4 inch high object to help maintain the best posture. When both feet are on the ground, the lower back tends to lose its slight inward curve. If this curve flattens (or becomes too large), then the back and your other joints will experience too much stress, tire more quickly, and can cause pain. CORRECT STANDING POSTURES Proper standing posture should be assumed with all daily activities, even if they only take a few moments, like when brushing your teeth. As in sitting, your ears should fall over your shoulders and your shoulders should fall over your hips. You should keep a slight tension in  your abdominal muscles to brace your spine. Your tailbone should point down to the ground, not behind your body, resulting in an over-extended swayback posture.  INCORRECT STANDING POSTURES  Common incorrect standing postures include a forward head, locked knees and/or an excessive swayback. WALKING Walk with an upright posture. Your ears, shoulders and hips should all line-up. PROLONGED ACTIVITY IN A FLEXED POSITION When completing a task that requires you to bend forward at your waist or lean over a low surface, try to find a way to stabilize 3 out of 4 of your limbs. You can place a hand or elbow on your thigh or rest a knee on the surface you are reaching across. This will provide you more stability, so that your muscles do not tire as quickly. By keeping your knees relaxed, or slightly bent, you will also reduce stress across your lower back. CORRECT LIFTING TECHNIQUES DO :  Assume a wide stance. This will provide you more stability and the opportunity to get as close as possible to the object which you are lifting.  Tense your abdominals to brace your spine. Bend at the knees and hips. Keeping your back locked in a neutral-spine position, lift using your leg muscles. Lift with your legs, keeping your back straight.  Test the weight of unknown objects before attempting to lift them.  Try to keep your elbows locked down at your sides in order get the best strength from your shoulders when carrying an object.  Always ask for help when lifting heavy or awkward objects. INCORRECT LIFTING TECHNIQUES DO NOT:   Lock your knees when lifting, even if it is a small object.  Bend and twist. Pivot at your feet or move your feet when needing to change directions.  Assume that you can safely pick up even a paperclip without proper posture.  This information is not intended to replace advice given to you by your health care provider. Make sure you discuss any questions you have with your health  care provider.   Document Released: 11/30/2005 Document Revised: 12/21/2014 Document Reviewed: 03/14/2009 Elsevier Interactive Patient Education [employer]

## 2016-08-04 DIAGNOSIS — M545 Low back pain, unspecified: Secondary | ICD-10-CM | POA: Insufficient documentation

## 2016-08-04 NOTE — Assessment & Plan Note (Signed)
Some radiation of the pain into the gluteus. Rx for voltaren and flexeril for pain relief. Talked to him about stretching and safe lifting techniques. No indications for imaging today.

## 2016-08-05 ENCOUNTER — Encounter (INDEPENDENT_AMBULATORY_CARE_PROVIDER_SITE_OTHER): Payer: Self-pay | Admitting: Diagnostic Neuroimaging

## 2016-08-05 DIAGNOSIS — Z0289 Encounter for other administrative examinations: Secondary | ICD-10-CM

## 2016-08-05 NOTE — Progress Notes (Signed)
Patient came into office for follow up visit. In exam room this RN inquired about his appointment with Dr Everlena CooperJaffe, neurologist in May 2017. Patient stated he couldn't keep up with which drs he sees. He then stated he wondered why he was called to come in to this office. He stated he and Dr Emilia BeckJafee discussed only seeing one neurologist. Dr Moises BloodJaffe's OV note stated patient had decided to follow up with him. Patient confirmed this statement.  Patient stated "I don't need to be here." He stated several times that he wanted to keep seeing Dr Everlena CooperJaffe. He then left this office, and he was not unpleasant. Follow up not completed.

## 2016-08-06 ENCOUNTER — Ambulatory Visit: Payer: Managed Care, Other (non HMO) | Admitting: Internal Medicine

## 2016-08-10 ENCOUNTER — Telehealth: Payer: Self-pay | Admitting: Internal Medicine

## 2016-08-10 NOTE — Telephone Encounter (Signed)
Printed and signed, please fax.  

## 2016-08-10 NOTE — Telephone Encounter (Signed)
Pt called stating his work place need a return to work letter stating without any restriction. Please advise, and fax over to [phone] att [place].

## 2016-08-10 NOTE — Telephone Encounter (Signed)
faxed

## 2016-08-14 NOTE — Progress Notes (Signed)
This encounter was created in error - please disregard.

## 2016-08-21 ENCOUNTER — Telehealth: Payer: Self-pay | Admitting: Neurology

## 2016-08-21 MED ORDER — PREDNISONE 10 MG PO TABS: ORAL_TABLET | ORAL | 0 refills | Status: DC

## 2016-08-21 MED ORDER — VERAPAMIL HCL 80 MG PO TABS: ORAL_TABLET | ORAL | 1 refills | Status: DC

## 2016-08-21 NOTE — Telephone Encounter (Signed)
Patient needs the prednisone called in to the [facility] pt phone number is [phone]

## 2016-08-21 NOTE — Telephone Encounter (Signed)
1.  We can increase verapamil to 80mg  in AM, 80mg  afternoon, and 160mg  at bedtime.  He should contact us in 2 weeks with update and we can make further dose-adjustments as needed. 2.  We can prescribe the following prednisone 10mg  taper:  Take 6tabs x1day, then 5tabs x1day, then 4tabs x1day, then 3tabs 1day, then 2tabs x1day, then 1tab x1day, then STOP 3.  He must make a follow up appointment.

## 2016-08-21 NOTE — Telephone Encounter (Signed)
Called patient. He has a very bad episode today that caused him to have to sit down and control his breathing for nearly 3 hours for it to pass. He has been taking his verapamil 80 mg TID. Pt feels that he has built a tolerance to that dosage and doesn't feel that it is working anymore. Pt would like a prednisone taper sent in. 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 Pt afraid this is going to trigger a sever cluster. Please advise.

## 2016-08-21 NOTE — Telephone Encounter (Signed)
RX sent in. Pt aware. Will call in 1 month. Pt scheduled for /19/17.

## 2016-09-01 ENCOUNTER — Ambulatory Visit: Payer: Managed Care, Other (non HMO) | Admitting: Neurology

## 2016-09-07 ENCOUNTER — Telehealth: Payer: Self-pay | Admitting: *Deleted

## 2016-09-07 ENCOUNTER — Telehealth: Payer: Self-pay | Admitting: Diagnostic Neuroimaging

## 2016-09-07 NOTE — Telephone Encounter (Signed)
Spoke with patient and informed him that per his Epic EMR, Dr Everlena CooperJaffe wrote a prescription for prednisone on 08/21/16 with tapering instructions and then to stop. Also advised patient that he cannot be treated by two neurologists, and the last time he mistakenly came to this office for FU, this was discussed with him. He verbalized understanding at that time and stated he wanted to follow up with Dr Everlena CooperJaffe. He stated he "had forgotten about that". He asked for phone number for Dr Moises BloodJaffe's office; phone number given to him.  He apologized for the call, verbalized understanding, appreciation.

## 2016-09-07 NOTE — Telephone Encounter (Signed)
Patient requesting refill of  predniSONE (DELTASONE) 10 MG tablet called to [facility] on [address].

## 2016-09-08 ENCOUNTER — Encounter: Payer: Self-pay | Admitting: Neurology

## 2016-09-08 ENCOUNTER — Telehealth: Payer: Self-pay | Admitting: Neurology

## 2016-09-08 NOTE — Telephone Encounter (Signed)
Patient is requesting refill on prednisone taper.

## 2016-09-08 NOTE — Telephone Encounter (Signed)
PT wanted to know about a prescription that was called in or not called in/Dawn CB# [phone]

## 2016-09-08 NOTE — Telephone Encounter (Signed)
Patient has had 3 no-shows and has been discharged from the practice

## 2016-09-09 ENCOUNTER — Telehealth: Payer: Self-pay | Admitting: Neurology

## 2016-09-09 ENCOUNTER — Ambulatory Visit: Payer: Managed Care, Other (non HMO) | Admitting: Internal Medicine

## 2016-09-09 ENCOUNTER — Other Ambulatory Visit: Payer: Self-pay | Admitting: *Deleted

## 2016-09-09 MED ORDER — PREDNISONE 10 MG PO TABS: 10.0000 mg | ORAL_TABLET | ORAL | 0 refills | Status: DC

## 2016-09-09 NOTE — Telephone Encounter (Signed)
However, we can prescribe him another prednisone taper to help his headache until he follows up with his PCP or establishes care with another neurologist (Prednisone 10mg  tablet:  Take 6tabs x three days, then 5tabs x three days, then 4tabs x three days, then 3tabs x three days, then 2tabs x three days, then 1tab x three days, then STOP

## 2016-09-09 NOTE — Telephone Encounter (Signed)
Rx sent in

## 2016-09-09 NOTE — Telephone Encounter (Signed)
Patient dismissed from [facility] by Adam Jaffe DO , effective September 08, 2016. Dismissal letter sent out by certified / registered mail.  °DAJ °

## 2016-09-09 NOTE — Telephone Encounter (Signed)
Sending back per your request

## 2016-09-25 ENCOUNTER — Ambulatory Visit: Payer: Managed Care, Other (non HMO) | Admitting: Neurology

## 2016-10-06 NOTE — Telephone Encounter (Signed)
Certified dismissal letter returned as undeliverable, unclaimed, return to sender after three attempts by [employer] on October 06, 2016. Letter placed in another envelope and resent as 1st class mail which does not require a signature. °DAJ °

## 2016-10-29 ENCOUNTER — Encounter: Payer: Self-pay | Admitting: Internal Medicine

## 2016-10-29 ENCOUNTER — Ambulatory Visit (INDEPENDENT_AMBULATORY_CARE_PROVIDER_SITE_OTHER): Payer: Managed Care, Other (non HMO) | Admitting: Internal Medicine

## 2016-10-29 VITALS — BP 138/78 | HR 90 | Temp 98.6°F | Resp 16 | Ht 72.0 in | Wt 211.0 lb

## 2016-10-29 DIAGNOSIS — G44021 Chronic cluster headache, intractable: Secondary | ICD-10-CM | POA: Diagnosis not present

## 2016-10-29 MED ORDER — PREDNISONE 10 MG PO TABS: 10.0000 mg | ORAL_TABLET | ORAL | 0 refills | Status: DC

## 2016-10-29 NOTE — Progress Notes (Signed)
Pre visit review using our clinic review tool, if applicable. No additional management support is needed unless otherwise documented below in the visit note. 

## 2016-10-29 NOTE — Progress Notes (Signed)
   Subjective:    Patient ID: Paul Prince, male    DOB: 1976-07-08, 40 y.o.   MRN: [id number]  HPI The patient is a 40 YO man coming in for his cluster headaches. He had been seeing neurology for the headaches but missed too many appointments and was dismissed. He was not sure why exactly. He is now having worsening headaches off his medications. He felt that the verapamil was not helping. He is wondering if he can have a prescription for prednisone to get him through holidays without misery from the pain of the headaches. They make him contemplate suicide from the pain which comes 5-6 times per day and lasts for 1-2 hours. He is still working but this is difficult some days.   Review of Systems  Constitutional: Positive for activity change. Negative for appetite change, chills, diaphoresis, fatigue and unexpected weight change.  Respiratory: Negative.   Cardiovascular: Negative.   Gastrointestinal: Negative.   Skin: Negative.   Neurological: Positive for headaches. Negative for dizziness, tremors, weakness, light-headedness and numbness.  Psychiatric/Behavioral: Positive for dysphoric mood.      Objective:   Physical Exam  Constitutional: He is oriented to person, place, and time. He appears well-developed and well-nourished.  HENT:  Head: Normocephalic and atraumatic.  Eyes: EOM are normal. Pupils are equal, round, and reactive to light.  Neck: Normal range of motion.  Cardiovascular: Normal rate and regular rhythm.   Pulmonary/Chest: Effort normal and breath sounds normal.  Abdominal: Soft.  Neurological: He is alert and oriented to person, place, and time.  Skin: Skin is warm and dry.   Vitals:   10/29/16 1605 10/29/16 1634  BP: (!) 142/60 138/78  Pulse: 90   Resp: 16   Temp: 98.6 F (37 C)   TempSrc: Oral   SpO2: 98%   Weight: 211 lb (95.7 kg)   Height: 6' (1.829 m)       Assessment & Plan:

## 2016-10-29 NOTE — Patient Instructions (Signed)
We have given you the prednisone.   Take 6 pills a day for 5 days then take 5 pills a day for 5 days then 4 pills a day for 5 days, then 3 pills a day for 5 days, then 2 pills a day for 5 days, then 1 pill a day for 5 days then stop.   We will get you in to a neurologist as well

## 2016-10-30 NOTE — Assessment & Plan Note (Signed)
Rx for prednisone and referral to neurology to get him in. He did not have good relief with verapamil.

## 2016-12-11 ENCOUNTER — Telehealth: Payer: Self-pay | Admitting: Internal Medicine

## 2016-12-11 NOTE — Telephone Encounter (Signed)
Please advise in PCP. I will call pt during clinic tomorrow.

## 2016-12-11 NOTE — Telephone Encounter (Signed)
Patient is requesting refill on prednisone.  Patient uses [facility] on [address].

## 2016-12-21 ENCOUNTER — Encounter: Payer: Self-pay | Admitting: Diagnostic Neuroimaging

## 2016-12-21 ENCOUNTER — Ambulatory Visit (INDEPENDENT_AMBULATORY_CARE_PROVIDER_SITE_OTHER): Payer: 59 | Admitting: Diagnostic Neuroimaging

## 2016-12-21 VITALS — BP 143/88 | HR 72 | Wt 212.0 lb

## 2016-12-21 DIAGNOSIS — G44009 Cluster headache syndrome, unspecified, not intractable: Secondary | ICD-10-CM

## 2016-12-21 DIAGNOSIS — G44019 Episodic cluster headache, not intractable: Secondary | ICD-10-CM | POA: Diagnosis not present

## 2016-12-21 MED ORDER — PREDNISONE 10 MG PO TABS: 60.0000 mg | ORAL_TABLET | Freq: Every day | ORAL | 6 refills | Status: DC

## 2016-12-21 MED ORDER — VERAPAMIL HCL 80 MG PO TABS: 80.0000 mg | ORAL_TABLET | Freq: Three times a day (TID) | ORAL | 12 refills | Status: DC

## 2016-12-21 MED ORDER — SUMATRIPTAN SUCCINATE 6 MG/0.5ML ~~LOC~~ SOLN: 6.0000 mg | Freq: Every day | SUBCUTANEOUS | 6 refills | Status: DC | PRN

## 2016-12-21 MED ORDER — TOPIRAMATE 25 MG PO TABS: 25.0000 mg | ORAL_TABLET | Freq: Two times a day (BID) | ORAL | 12 refills | Status: DC

## 2016-12-21 NOTE — Progress Notes (Signed)
[facility]  PATIENT: Paul Prince DOB: [birth year]  REFERRING CLINICIAN: Burns HISTORY FROM: patient  REASON FOR VISIT: follow up   HISTORICAL  CHIEF COMPLAINT:  Chief Complaint  Patient presents with  . Headache    rm 7, "HA no better; stopped Verapamil 1 yr ago due to bruising;  ran out of Prednisone 1  1/2 wks ago; 3 HA yesterday; triggers- strong odors, stress, anger, lack of sleep"    HISTORY OF PRESENT ILLNESS:   UPDATE 12/21/16: Since last visit, follow up with Dr. Everlena CooperJaffe, but then was dismissed due to 3 no-shows. Has been trying to survive with intermittent prednisone tapering courses. He feels that verapamil and topiramate caused him to have excessive bruising. Has not tried sumatriptan [place] due to cost. Not tried verapamil and topiramate together as combination.   PRIOR HPI (01/24/16): 41 year old right-handed male here for evaluation of cluster headaches. Patient had onset of headaches in 2007 following a work-related injury. Patient was on a work site when a guardrail was dislodged, spun around and struck him in the face. Patient had severe injury including severe facial laceration. He went to local emergency room, had suture repair of his laceration, observation and then discharged. He had to take one week off of work and then was able to return. Soon after patient had onset of intermittent severe 10 out of 10 headaches involving his left nostril, nose, teeth, face and eye. He describes severe stabbing burning boring pain on left side. Sometimes associated with nausea, agitation and anxiety. Sometimes this is associated with severe whole-body tremors. Sometimes has pins and needle sensation and scalp. Certain smells can trigger headaches. Sometimes eating and then going to sleep immediately can trigger headache. Even 1 bottle of beer can trigger severe headaches. Sometimes he has 6 months of severe headaches on multiple per day, other times he goes several  months without them. In the last few years he is having at least 3-4 of the severe headaches per day. He typically uses Goody powder within a few minutes of onset and sometimes is able to prevent headaches from getting full-blown. In spite of this headaches will last 30 minutes. If he does not take medication early that headaches may last 2-3 hours. Patient has tried right medications from PCP and another neurologist including prednisone, topiramate, oxygen therapy, sumatriptan nasal spray, sumatriptan and oral tablet, verapamil, all without significant relief.   REVIEW OF SYSTEMS: Full 14 system review of systems performed and notable only for eye pain headache not enough sleep averaging 2-3 hours per night, decreased energy.  ALLERGIES: No Known Allergies  HOME MEDICATIONS: Outpatient Medications Prior to Visit  Medication Sig Dispense Refill  . Aspirin-Acetaminophen-Caffeine (GOODY HEADACHE PO) Take by mouth 2 (two) times daily.    . predniSONE (DELTASONE) 10 MG tablet Take 1 tablet (10 mg total) by mouth See admin instructions. Take 6 tablets x 5 days then 5 tablets x 5 days then 4 tablets x 5 days then 3 tablets x 5 days then 2 tablets x 5 days then 1 tablet x 5 days then STOP. (Patient not taking: Reported on 12/21/2016) 105 tablet 0  . verapamil (CALAN) 80 MG tablet 80mg  in AM, 80mg  afternoon, and 160mg  at bedtime (Patient not taking: Reported on 12/21/2016) 120 tablet 1   No facility-administered medications prior to visit.     PAST MEDICAL HISTORY: Past Medical History:  Diagnosis Date  . Chronic headache     PAST SURGICAL HISTORY: Past Surgical History:  Procedure Laterality Date  . EXAMINATION UNDER ANESTHESIA N/A 08/11/2014   Procedure: EXAM UNDER ANESTHESIA;  Surgeon: Liz Malady, MD;  Location: [facility] OR;  Service: General;  Laterality: N/A;  . INCISION AND DRAINAGE PERIRECTAL ABSCESS  08/11/2014   Procedure: IRRIGATION AND DEBRIDEMENT PERIRECTAL ABSCESS;  Surgeon: Liz Malady, MD;  Location: [facility] OR;  Service: General;;    FAMILY HISTORY: Family History  Problem Relation Age of Onset  . Bipolar disorder Mother   . Heart attack Father     SOCIAL HISTORY:  Social History   Social History  . Marital status: Legally Separated    Spouse name: N/A  . Number of children: 6  . Years of education: 34   Occupational History  .      truck driver, Cytogeneticist   Social History Main Topics  . Smoking status: Current Every Day Smoker    Packs/day: 1.00    Years: 20.00  . Smokeless tobacco: Never Used     Comment: 01/24/16 black and mild cigars (3-4 daily)  . Alcohol use 0.0 oz/week     Comment: weekends  . Drug use: No  . Sexual activity: Not on file   Other Topics Concern  . Not on file   Social History Narrative   Lives alone   Caffeine use- none     PHYSICAL EXAM  GENERAL EXAM/CONSTITUTIONAL: Vitals:  Vitals:   12/21/16 0915  BP: (!) 143/88  Pulse: 72  Weight: 212 lb (96.2 kg)   Body mass index is 28.75 kg/m. No exam data present  Patient is in no distress; well developed, nourished and groomed; neck is supple  CARDIOVASCULAR:  Examination of carotid arteries is normal; no carotid bruits  Regular rate and rhythm, no murmurs  Examination of peripheral vascular system by observation and palpation is normal  EYES:  Ophthalmoscopic exam of optic discs and posterior segments is normal; no papilledema or hemorrhages  MUSCULOSKELETAL:  Gait, strength, tone, movements noted in Neurologic exam below  NEUROLOGIC: MENTAL STATUS:  No flowsheet data found.  awake, alert, oriented to person, place and time  recent and remote memory intact  normal attention and concentration  language fluent, comprehension intact, naming intact,   fund of knowledge appropriate  CRANIAL NERVE:   2nd - no papilledema on fundoscopic exam  2nd, 3rd, 4th, 6th - pupils equal and reactive to light, visual fields full to  confrontation, extraocular muscles intact, no nystagmus  5th - facial sensation symmetric  7th - facial strength symmetric  8th - hearing intact  9th - palate elevates symmetrically, uvula midline  11th - shoulder shrug symmetric  12th - tongue protrusion midline  MOTOR:   normal bulk and tone, full strength in the BUE, BLE  SENSORY:   normal and symmetric to light touch, temperature, vibration  COORDINATION:   finger-nose-finger, fine finger movements normal  REFLEXES:   deep tendon reflexes present and symmetric  GAIT/STATION:   narrow based gait    DIAGNOSTIC DATA (LABS, IMAGING, TESTING) - I reviewed patient records, labs, notes, testing and imaging myself where available.  Lab Results  Component Value Date   WBC 10.3 05/06/2016   HGB 12.9 (L) 05/06/2016   HCT 39.3 05/06/2016   MCV 93.6 05/06/2016   PLT 275.0 05/06/2016      Component Value Date/Time   NA 141 05/06/2016 1053   K 4.1 05/06/2016 1053   CL 108 05/06/2016 1053   CO2 29 05/06/2016 1053   GLUCOSE 103 (  H) 05/06/2016 1053   BUN 15 05/06/2016 1053   CREATININE 0.84 05/06/2016 1053   CALCIUM 9.6 05/06/2016 1053   PROT 6.9 05/06/2016 1053   ALBUMIN 4.7 05/06/2016 1053   AST 12 05/06/2016 1053   ALT 15 05/06/2016 1053   ALKPHOS 59 05/06/2016 1053   BILITOT 0.3 05/06/2016 1053   GFRNONAA >90 08/10/2014 2155   GFRAA >90 08/10/2014 2155   Lab Results  Component Value Date   CHOL 180 05/06/2016   HDL 40.80 05/06/2016   LDLCALC 129 (H) 05/06/2016   TRIG 52.0 05/06/2016   CHOLHDL 4 05/06/2016   Lab Results  Component Value Date   HGBA1C 6.1 05/06/2016   Lab Results  Component Value Date   VITAMINB12 395 05/06/2016   Lab Results  Component Value Date   TSH 0.40 05/06/2016     02/25/09 CT head [I reviewed images myself and agree with interpretation. -VRP]  - No acute intracranial findings or mass lesions.     ASSESSMENT AND PLAN  41 y.o. 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 Now patient having multiple severe headaches per day. He has tried multiple individual medications in the past without relief. Has not tried combination therapy.   Dx:  Cluster headache, not intractable, unspecified chronicity pattern  Episodic cluster headache, not intractable    PLAN:  CLUSTER HEADACHE PREVENTION: - Week 1: start prednisone 60mg  daily; reduce by 10mg  every month (long-term corticosteroid side effects reviewed with patient); also start melatonin supplement at night - Week 2: start verapamil 80mg  twice a day; after 1 week increase to 80mg  three times per day - Week 4: start topiramte 25mg  twice a day; after 1 week increase to 50mg  twice a day - Discussed surgical treatment options such as DBS, VNS, or SPG stimulation; will hold off for now as patient has not yet tried combination therapy yet - Consider referral to headache specialist  CLUSTER HEAD RESCUE TREATMENT - use sumatriptan [place] injection for cluster headache rescue (tried and failed oral and nasal sumatriptan) - use oxygen at home as needed  HEALTH PREVENTATIVE  - take prevacid or prilosec over the counter for stomach protection - take daily multi-vitamin for bone health (especially calcium and vitamin D)  Meds ordered this encounter  Medications  . verapamil (CALAN) 80 MG tablet    Sig: Take 1 tablet (80 mg total) by mouth 3 (three) times daily.    Dispense:  90 tablet    Refill:  12  . topiramate (TOPAMAX) 25 MG tablet    Sig: Take 1 tablet (25 mg total) by mouth 2 (two) times daily.    Dispense:  60 tablet    Refill:  12  . predniSONE (DELTASONE) 10 MG tablet    Sig: Take 6 tablets (60 mg total) by mouth daily with breakfast. Every month, reduce by 10mg  per day    Dispense:  180 tablet    Refill:  6  . SUMAtriptan (IMITREX) 6 MG/0.5ML SOLN injection    Sig: Inject 0.5 mLs (6 mg total) into the skin daily as needed  (cluster headache).    Dispense:  6 vial    Refill:  6   Return in about 3 months (around 03/21/2017).    Suanne Marker, MD 12/21/2016, 9:40 AM Certified in Neurology, Neurophysiology and Neuroimaging  [facility] [address] [place], [place] [address] [phone]

## 2016-12-21 NOTE — Patient Instructions (Addendum)
  CLUSTER HEADACHE PREVENTION:  - Week 1: start prednisone 60mg  daily; reduce by 10mg  every month; also start melatonin supplement at night  - Week 2: start verapamil 80mg  twice a day; after 1 week increase to 80mg  three times per day  - Week 4: start topiramte 25mg  twice a day; after 1 week increase to 50mg  twice a day   CLUSTER HEAD RESCUE TREATMENT - use sumatriptan [place] injection for cluster headache rescue - use oxygen at home as needed  HEALTH PREVENTATIVE  - take prevacid or prilosec over the counter for stomach protection - take daily multi-vitamin for bone health (especially calcium and vitamin D)

## 2017-02-08 ENCOUNTER — Telehealth: Payer: Self-pay | Admitting: Internal Medicine

## 2017-02-08 NOTE — Telephone Encounter (Signed)
Patient Name: Paul Prince  DOB: 26-Mar-1976    Initial Comment Caller states having chest pains past 2-3 wks with pain sometimes going into left jaw; having flashes of uncomfortableness now;    Nurse Assessment  Nurse: Stefano GaulStringer, RN, Dwana CurdVera Date/Time (Eastern Time): 02/08/2017 9:50:52 AM  Confirm and document reason for call. If symptomatic, describe symptoms. ---Caller states he has been having chest pain for the past 2-3 weeks. He is taking prednisone and verapamil pain is in the center of his chest and radiates to his left jaw and sometimes radiates to his left arm. No pain now. had pain over the weekend and early this am. Pain last about 30 min. No dizziness or SOB.  Does the patient have any new or worsening symptoms? ---Yes  Will a triage be completed? ---Yes  Related visit to physician within the last 2 weeks? ---No  Does the PT have any chronic conditions? (i.e. diabetes, asthma, etc.) ---Yes  List chronic conditions. ---acid reflux; chronic migraines  Is this a behavioral health or substance abuse call? ---No     Guidelines    Guideline Title Affirmed Question Affirmed Notes  Chest Pain Chest pain lasts > 5 minutes (Exceptions: chest pain occurring > 3 days ago and now asymptomatic; same as previously diagnosed heartburn and has accompanying sour taste in mouth)    Final Disposition User   Go to ED Now (or PCP triage) Stefano GaulStringer, RN, Vera    Comments  [facility]. pt states he has appt at 9 am tomorrow. He does not want to go to ER or urgent care at this time but states he will go if pain gets worse or he has SOB.  Call[facility] back line and [facility] and gave report that pt has been having chest pain for 2-3 weeks. Lasts about 30 min. Had chest pain over the weekend and early this am. No SOB. No chest pain now. Triage outcome of go to ER now (or PCP triage). No appts available within 4 hrs. Pt does not want to go to urgent care or ER as he states he has  appt with Dr. [name] at 9 am tomorrow.   Referrals  GO TO FACILITY REFUSED   Disagree/Comply: Disagree  Disagree/Comply Reason: Disagree with instructions

## 2017-02-09 ENCOUNTER — Encounter: Payer: Self-pay | Admitting: Internal Medicine

## 2017-02-09 ENCOUNTER — Ambulatory Visit (INDEPENDENT_AMBULATORY_CARE_PROVIDER_SITE_OTHER): Payer: Managed Care, Other (non HMO) | Admitting: Internal Medicine

## 2017-02-09 ENCOUNTER — Other Ambulatory Visit (INDEPENDENT_AMBULATORY_CARE_PROVIDER_SITE_OTHER): Payer: Managed Care, Other (non HMO)

## 2017-02-09 VITALS — BP 150/90 | HR 78 | Temp 98.5°F | Ht 72.0 in | Wt 217.0 lb

## 2017-02-09 DIAGNOSIS — R0789 Other chest pain: Secondary | ICD-10-CM | POA: Diagnosis not present

## 2017-02-09 LAB — CBC
HCT: 43.4 % (ref 39.0–52.0)
HEMOGLOBIN: 14.7 g/dL (ref 13.0–17.0)
MCHC: 33.8 g/dL (ref 30.0–36.0)
MCV: 95.7 fl (ref 78.0–100.0)
PLATELETS: 266 10*3/uL (ref 150.0–400.0)
RBC: 4.54 Mil/uL (ref 4.22–5.81)
RDW: 15 % (ref 11.5–15.5)
WBC: 6.9 10*3/uL (ref 4.0–10.5)

## 2017-02-09 LAB — COMPREHENSIVE METABOLIC PANEL
ALBUMIN: 4.4 g/dL (ref 3.5–5.2)
ALK PHOS: 47 U/L (ref 39–117)
ALT: 12 U/L (ref 0–53)
AST: 12 U/L (ref 0–37)
BILIRUBIN TOTAL: 0.3 mg/dL (ref 0.2–1.2)
BUN: 15 mg/dL (ref 6–23)
CALCIUM: 9.8 mg/dL (ref 8.4–10.5)
CO2: 29 mEq/L (ref 19–32)
CREATININE: 0.97 mg/dL (ref 0.40–1.50)
Chloride: 104 mEq/L (ref 96–112)
GFR: 109.47 mL/min (ref >60.00)
Glucose, Bld: 77 mg/dL (ref 70–99)
Potassium: 4.2 mEq/L (ref 3.5–5.1)
Sodium: 139 mEq/L (ref 135–145)
Total Protein: 6.8 g/dL (ref 6.0–8.3)

## 2017-02-09 LAB — LIPASE: Lipase: 71 U/L — ABNORMAL HIGH (ref 11.0–59.0)

## 2017-02-09 LAB — TROPONIN I: TNIDX: 0 ug/L (ref 0.00–0.06)

## 2017-02-09 MED ORDER — PANTOPRAZOLE SODIUM 40 MG PO TBEC: 40.0000 mg | DELAYED_RELEASE_TABLET | Freq: Two times a day (BID) | ORAL | 0 refills | Status: DC

## 2017-02-09 NOTE — Patient Instructions (Addendum)
Your EKG is the same as last year.   We are going to check the labs today and call you back with those.   We have sent in the stomach medicine called protonix (pantoprazole) which you will take twice a day for 1 month then once a day while on the prednisone.   If you are not feeling better in 1 week call us back and we will need to look more into if the gallbladder could be causing these pain episodes.

## 2017-02-09 NOTE — Progress Notes (Signed)
Pre visit review using our clinic review tool, if applicable. No additional management support is needed unless otherwise documented below in the visit note. 

## 2017-02-09 NOTE — Assessment & Plan Note (Signed)
Likely GERD with the recent long term prednisone usage and no stomach protection. EKG without changes from prior. Checking CBC, CMP, lipase, troponin today. Rx for protonix BID and if resolved confirms diagnosis. If no improvement needs evaluation for gallstones which is the second most likely etiology.

## 2017-02-09 NOTE — Progress Notes (Signed)
   Subjective:    Patient ID: Paul Prince, male    DOB: [birth year], 41 y.o.   MRN: [id number]  HPI The patient is a 41 YO man coming in for chest pains. Started a couple weeks ago. Has had 2-3 episodes since that time. The first episode happened at work and started with pain in the stomach and went up and into the right chest. He was in severe pain (10/10) and had to lie down. He was given about 6 tums which helps some but still had low-moderate pain for about 2 hours. Also his stomach felt poorly for a while after the episode. He has had two other episodes and they lasted about 2 hours. He has noticed that tomato based sauces and certain foods are more likely to cause this to happen. He has tried to avoid them. Denies taking NSAIDs over the counter often. He is a smoker and diet is poor at this time. Denies sweating during episode. No pain in the left chest or arms.   [facility], [facility], social history reviewed and updated.   Review of Systems  Constitutional: Positive for activity change and appetite change. Negative for chills, fatigue, fever and unexpected weight change.  HENT: Negative.   Eyes: Negative.   Respiratory: Negative for cough, chest tightness, shortness of breath and wheezing.   Cardiovascular: Positive for chest pain. Negative for palpitations and leg swelling.  Gastrointestinal: Positive for abdominal pain. Negative for abdominal distention, blood in stool, constipation, diarrhea, nausea and vomiting.       GERD symptoms and belching  Musculoskeletal: Negative.   Skin: Negative.   Neurological: Negative.   Psychiatric/Behavioral: Negative.       Objective:   Physical Exam  Constitutional: He is oriented to person, place, and time. He appears well-developed and well-nourished.  HENT:  Head: Normocephalic and atraumatic.  Eyes: EOM are normal.  Neck: Normal range of motion.  Cardiovascular: Normal rate and regular rhythm.   No murmur heard. Pulmonary/Chest: Effort normal and  breath sounds normal. No respiratory distress. He has no wheezes. He has no rales. He exhibits no tenderness.  Abdominal: Soft. Bowel sounds are normal. He exhibits no distension. There is no tenderness. There is no rebound.  Musculoskeletal: He exhibits no edema.  Neurological: He is alert and oriented to person, place, and time.  Skin: Skin is warm and dry.   Vitals:   02/09/17 0900  BP: (!) 150/90  Pulse: 78  Temp: 98.5 F (36.9 C)  TempSrc: Oral  SpO2: 98%  Weight: 217 lb (98.4 kg)  Height: 6' (1.829 m)   EKG: Rate 63, axis normal, intervals normal, sinus, no st or t wave changes. No change when compared to EKG from 04/2016.     Assessment & Plan:

## 2017-03-22 ENCOUNTER — Ambulatory Visit: Payer: 59 | Admitting: Diagnostic Neuroimaging

## 2017-03-23 ENCOUNTER — Encounter: Payer: Self-pay | Admitting: Diagnostic Neuroimaging

## 2017-06-08 ENCOUNTER — Telehealth: Payer: Self-pay | Admitting: Internal Medicine

## 2017-06-08 NOTE — Telephone Encounter (Signed)
In my opinion he needs to f/u w/Dr Penumali. Most offices would have a NS fee Thx

## 2017-06-08 NOTE — Telephone Encounter (Signed)
Please advise per Dr.Crawfords absence, but all neurologist offices are going to have a no show fee

## 2017-06-08 NOTE — Telephone Encounter (Signed)
Patient is no happy with the neurologist office we have referred him to b/c of a no show fee he has received and states he also is having same symptoms.  He was referred to [facility] - DR. Penumalli.  Patient is requesting another referral to be placed to a different neurology office.

## 2017-06-09 ENCOUNTER — Encounter: Payer: Self-pay | Admitting: Internal Medicine

## 2017-06-09 ENCOUNTER — Ambulatory Visit (INDEPENDENT_AMBULATORY_CARE_PROVIDER_SITE_OTHER): Payer: Managed Care, Other (non HMO) | Admitting: Internal Medicine

## 2017-06-09 VITALS — BP 162/100 | HR 62 | Ht 72.0 in | Wt 205.0 lb

## 2017-06-09 DIAGNOSIS — I10 Essential (primary) hypertension: Secondary | ICD-10-CM | POA: Insufficient documentation

## 2017-06-09 DIAGNOSIS — G44019 Episodic cluster headache, not intractable: Secondary | ICD-10-CM | POA: Diagnosis not present

## 2017-06-09 MED ORDER — VALSARTAN 160 MG PO TABS: 160.0000 mg | ORAL_TABLET | Freq: Every day | ORAL | 3 refills | Status: DC

## 2017-06-09 MED ORDER — IBUPROFEN 800 MG PO TABS: 800.0000 mg | ORAL_TABLET | Freq: Three times a day (TID) | ORAL | 1 refills | Status: DC | PRN

## 2017-06-09 NOTE — Assessment & Plan Note (Addendum)
With recent flare in severity and freq of symptoms, no well amenable to current tx, though admits not taking some of it.  FMLA form to be signed, refer Neurology, and ibuprofen refill as this has helped somewhat per PCP before  Note:  Total time for pt hx, exam, review of record with pt in the room, determination of diagnoses and plan for further eval and tx is > 40 min, with over 50% spent in coordination and counseling of patient, including the differential dx, eval and tx of Headache

## 2017-06-09 NOTE — Progress Notes (Signed)
Subjective:    Patient ID: Paul Prince, male    DOB: March 28, 1976, 41 y.o.   MRN: [id number]  HPI  Here to f/u with me for episodic cluster headaches x 15 yrs, as PCP is on medical leave.  Pt requests FMLA as he conts to have frequent recurring HA's, was out of work for over 1 wk last yr and not paid, and admits he has to be out 8 days to be paid for ST disability after discussing this with his HR at work.Marland Kitchen.  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  Carries a cold pack in a bag with him every day as this helps during the worse of the attacks.. Feels better with sit up (not lying down) up to 3 hrs or more.  Has oxygen in closet and slept upright sitting in the closet last night b/c that's where the tank is.  Does not take rolling tank ouf of the closet to avoid disturbing the fiancee there.  HA's also worse with some perfumes, and he tries to actively avoid nitrates in food and bright lights.  Typical HA is sudden onset one sided either right > left with  severe sharp pain behind the eye with eye watering and blurry vision and shaking all over due to anxiety.  Has not tried med for nerves. Tx for cluster HA per neurology has been verapamil, prednisone, oxygen in the past.  Also PCP rx some nsaid prn, ran out last wk.  Does not take his imitrex but started to feel loopy and couldn't take at work with driving.  Asks for out of work from today until July 16  Has had periods of time off in the past for 2 wks, last episode about 1 yr ago. Needs FMLA filled out.  Aks for change neurology as well for second opinion.  BP elevated consistently.  Not taking verapamil right now.   BP Readings from Last 3 Encounters:  06/09/17 (!) 162/100  02/09/17 (!)  150/90  12/21/16 (!) 143/88  Has become frustrated with current tx and is asking for referral to different neurologist. Past Medical History:  Diagnosis Date  . Chronic headache    Past Surgical History:  Procedure Laterality Date  . EXAMINATION UNDER ANESTHESIA N/A 08/11/2014   Procedure: EXAM UNDER ANESTHESIA;  Surgeon: Liz MaladyBurke E Thompson, MD;  [facility];  Service: General;  Laterality: N/A;  . INCISION AND DRAINAGE PERIRECTAL ABSCESS  08/11/2014   Procedure: IRRIGATION AND DEBRIDEMENT PERIRECTAL ABSCESS;  Surgeon: Liz MaladyBurke E Thompson, MD;  Location: [facility] OR;  Service: General;;    reports that he has been smoking.  He has a 20.00 pack-year smoking history. He has never used smokeless tobacco. He reports that he drinks alcohol. He reports that he does not use drugs. family history includes Bipolar disorder in his mother; Heart attack in his father. No Known Allergies   Current Outpatient Prescriptions on File Prior to Visit  Medication Sig Dispense Refill  . Aspirin-Acetaminophen-Caffeine (GOODY HEADACHE PO) Take by mouth 2 (two) times daily.    . pantoprazole (PROTONIX) 40 MG tablet Take 1 tablet (40 mg total) by mouth 2 (two) times daily. 60 tablet 0  . verapamil (CALAN) 80 MG  tablet Take 1 tablet (80 mg total) by mouth 3 (three) times daily. 90 tablet 12   No current facility-administered medications on file prior to visit.    Review of Systems  Constitutional: Negative for other unusual diaphoresis or sweats HENT: Negative for ear discharge or swelling Eyes: Negative for other worsening visual disturbances Respiratory: Negative for stridor or other swelling  Gastrointestinal: Negative for worsening distension or other blood Genitourinary: Negative for retention or other urinary change Musculoskeletal: Negative for other MSK pain or swelling Skin: Negative for color change or other new lesions Neurological: Negative for worsening tremors and other numbness    Psychiatric/Behavioral: Negative for worsening agitation or other fatigue All other system neg per pt    Objective:   Physical Exam BP (!) 162/100   Pulse 62   Ht 6' (1.829 m)   Wt 205 lb (93 kg)   SpO2 100%   BMI 27.80 kg/m  VS noted,  Constitutional: Pt appears in NAD HENT: Head: NCAT.  Right Ear: External ear normal.  Left Ear: External ear normal.  Eyes: . Pupils are equal, round, and reactive to light. Conjunctivae and EOM are normal Nose: without d/c or deformity Neck: Neck supple. Gross normal ROM Cardiovascular: Normal rate and regular rhythm.   Pulmonary/Chest: Effort normal and breath sounds without rales or wheezing.  Abd:  Soft, NT, ND, + BS, no organomegaly Neurological: Pt is alert. At baseline orientation, motor 5/5 intact, cn 2-12 intact Skin: Skin is warm. No rashes, other new lesions, no LE edema Psychiatric: Pt behavior is normal without agitation  No other exam findings    Assessment & Plan:

## 2017-06-09 NOTE — Patient Instructions (Addendum)
Please take all new medication as prescribed - the generic Diovan 160 mg per day for elevated Blood Pressure  Please return in 2 weeks for Nurse Visit only for Blood pressure check  Please continue all other medications as before, and refills have been done if requested. - the ibuprofen  Your FMLA form will be filled out by tomorrow  Please have the pharmacy call with any other refills you may need.  Please continue your efforts at being more active, low cholesterol diet, and weight control.  Please keep your appointments with your specialists as you may have planned  You will be contacted regarding the referral for: Neurology

## 2017-06-09 NOTE — Assessment & Plan Note (Signed)
Recent onset, not clear how much contributes to HA, to start diovan 160 qd, f/u BP with nurse visit in 2 wks

## 2017-06-10 DIAGNOSIS — Z0279 Encounter for issue of other medical certificate: Secondary | ICD-10-CM

## 2017-06-11 NOTE — Telephone Encounter (Signed)
Patient stated he will just pay the fee and stay with Dr.Penumali

## 2017-06-21 ENCOUNTER — Encounter: Payer: Self-pay | Admitting: Diagnostic Neuroimaging

## 2017-06-21 ENCOUNTER — Ambulatory Visit (INDEPENDENT_AMBULATORY_CARE_PROVIDER_SITE_OTHER): Payer: 59 | Admitting: Diagnostic Neuroimaging

## 2017-06-21 VITALS — BP 156/89 | HR 81 | Ht 72.0 in | Wt 208.8 lb

## 2017-06-21 DIAGNOSIS — I10 Essential (primary) hypertension: Secondary | ICD-10-CM | POA: Diagnosis not present

## 2017-06-21 DIAGNOSIS — G44011 Episodic cluster headache, intractable: Secondary | ICD-10-CM

## 2017-06-21 NOTE — Patient Instructions (Signed)
CLUSTER HEADACHE PREVENTION: - continue prednisone taper (long-term corticosteroid side effects reviewed with patient) - melatonin supplement at night - discussed surgical treatment options such as DBS (hypothalamus), VNS, or SPG stimulation or other nerve ablation; will refer to [facility] Dr. Angelyn Puntatter for consideration of treatment options  CLUSTER HEAD RESCUE TREATMENT - use oxygen at home as needed - use goody's powder as needed  HEALTH PREVENTATIVE  - take prevacid or prilosec over the counter for stomach protection - take daily multi-vitamin for bone health (especially calcium and vitamin D)

## 2017-06-21 NOTE — Progress Notes (Signed)
[facility]  PATIENT: Paul Prince DOB: August 04, 1976  REFERRING CLINICIAN: Burns HISTORY FROM: patient  REASON FOR VISIT: follow up   HISTORICAL  CHIEF COMPLAINT:  Chief Complaint  Patient presents with  . NX  Paul Prince    Pt last seen 12/2016 by Dr. Marjory Lies  . episodic cluster headache    R sided cluster headachces.  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 Jonny Ruiz.  Due to back 06-28-17.    HISTORY OF PRESENT ILLNESS:   UPDATE 06/21/17: Since last visit, was doing a little better in Feb / March 2018. Then in June 2018, HA worsened, possibly related to heat. Now with 3-5 HA per day or more. Tried meds verapamil and TPX without benefit. Could not get sumatriptan [place] injection due to insurance limitations.  UPDATE 12/21/16: Since last visit, follow up with Dr. Everlena Cooper, but then was dismissed due to 3 no-shows. Has been trying to survive with intermittent prednisone tapering courses. He feels that verapamil and topiramate caused him to have excessive bruising. Has not tried sumatriptan [place] due to cost. Not tried verapamil and topiramate together as combination.   PRIOR HPI (01/24/16): 41 year old right-handed male here for evaluation of cluster headaches. Patient had onset of headaches in 2007 following a work-related injury. Patient was on a work site when a guardrail was dislodged, spun around and struck him in the face. Patient had severe injury including severe facial laceration. He went to local emergency room, had suture repair of his laceration, observation and then discharged. He had to take one week off of work and then was able to return. Soon after patient had onset of intermittent severe 10 out of 10 headaches involving his left nostril, nose, teeth, face and eye. He describes severe stabbing burning boring pain on left side. Sometimes associated with nausea, agitation and anxiety. Sometimes this is associated with severe whole-body tremors. Sometimes has pins and  needle sensation and scalp. Certain smells can trigger headaches. Sometimes eating and then going to sleep immediately can trigger headache. Even 1 bottle of beer can trigger severe headaches. Sometimes he has 6 months of severe headaches on multiple per day, other times he goes several months without them. In 41 year old the last few years he is having at least 3-4 of the severe headaches per day. He typically uses Goody powder within a few minutes of onset and sometimes is able to prevent headaches from getting full-blown. In spite of this headaches will last 30 minutes. If he does not take medication early that headaches may last 2-3 hours. Patient has tried right medications from PCP and another neurologist including prednisone, topiramate, oxygen therapy, sumatriptan nasal spray, sumatriptan and oral tablet, verapamil, all without significant relief.   REVIEW OF SYSTEMS: Full 14 system review of systems performed and notable only for eye pain headache not enough sleep averaging 2-3 hours per night, decreased energy.  ALLERGIES: Allergies  Allergen Reactions  . Verapamil     Stated causing bruising    HOME MEDICATIONS: Outpatient Medications Prior to Visit  Medication Sig Dispense Refill  . Aspirin-Acetaminophen-Caffeine (GOODY HEADACHE PO) Take by mouth 2 (two) times daily.    Marland Kitchen ibuprofen (ADVIL,MOTRIN) 800 MG tablet Take 1 tablet (800 mg total) by mouth every 8 (eight) hours as needed. 40 tablet 1  . valsartan (DIOVAN) 160 MG tablet Take 1 tablet (160 mg total) by mouth daily. 90 tablet 3  . pantoprazole (PROTONIX) 40 MG tablet Take 1 tablet (40 mg total) by mouth 2 (  two) times daily. (Patient not taking: Reported on 06/21/2017) 60 tablet 0  . verapamil (CALAN) 80 MG tablet Take 1 tablet (80 mg total) by mouth 3 (three) times daily. (Patient not taking: Reported on 06/21/2017) 90 tablet 12   No facility-administered medications prior to visit.     PAST MEDICAL HISTORY: Past Medical History:    Diagnosis Date  . Chronic headache     PAST SURGICAL HISTORY: Past Surgical History:  Procedure Laterality Date  . EXAMINATION UNDER ANESTHESIA N/A 08/11/41   Procedure: EXAM UNDER ANESTHESIA;  Surgeon: Liz MaladyBurke E Thompson, MD;  [facility];  Service: General;  Laterality: N/A;  . INCISION AND DRAINAGE PERIRECTAL ABSCESS  08/11/2014   Procedure: IRRIGATION AND DEBRIDEMENT PERIRECTAL ABSCESS;  Surgeon: Liz MaladyBurke E Thompson, MD;  Location: [facility] OR;  Service: General;;    FAMILY HISTORY: Family History  Problem Relation Age of Onset  . Bipolar disorder Mother   . Heart attack Father     SOCIAL HISTORY:  Social History   Social History  . Marital status: Legally Separated    Spouse name: N/A  . Number of children: 6  . Years of education: 1511   Occupational History  .      truck driver, [employer]   Social History Main Topics  . Smoking status: Current Every Day Smoker    Packs/day: 0.45    Years: 20.00  . Smokeless tobacco: Never Used     Comment: 01/24/16 black and mild cigars (3-4 daily)  . Alcohol use 0.0 oz/week     Comment: weekends  . Drug use: No  . Sexual activity: Not on file   Other Topics Concern  . Not on file   Social History Narrative   Lives with SO and 2 14 yr girls.  Is a truck driver.    Caffeine use- none     PHYSICAL EXAM  GENERAL EXAM/CONSTITUTIONAL: Vitals:  Vitals:   06/21/17 1110  BP: (!) 156/89  Pulse: 81  Weight: 208 lb 12.8 oz (94.7 kg)  Height: 6' (1.829 m)   Body mass index is 28.32 kg/m.  Visual Acuity Screening   Right eye Left eye Both eyes  Without correction: 20/30 20/20   With correction:       Patient is in no distress; well developed, nourished and groomed; neck is supple  CARDIOVASCULAR:  Examination of carotid arteries is normal; no carotid bruits  Regular rate and rhythm, no murmurs  Examination of peripheral vascular system by observation and palpation is normal  EYES:  Ophthalmoscopic  exam of optic discs and posterior segments is normal; no papilledema or hemorrhages  MUSCULOSKELETAL:  Gait, strength, tone, movements noted in Neurologic exam below  NEUROLOGIC: MENTAL STATUS:  No flowsheet data found.  awake, alert, oriented to person, place and time  recent and remote memory intact  normal attention and concentration  language fluent, comprehension intact, naming intact,   fund of knowledge appropriate  CRANIAL NERVE:   2nd - no papilledema on fundoscopic exam  2nd, 3rd, 4th, 6th - pupils equal and reactive to light, visual fields full to confrontation, extraocular muscles intact, no nystagmus  5th - facial sensation symmetric  7th - facial strength symmetric  8th - hearing intact  9th - palate elevates symmetrically, uvula midline  11th - shoulder shrug symmetric  12th - tongue protrusion midline  MOTOR:   normal bulk and tone, full strength in the BUE, BLE  SENSORY:   normal and symmetric to light touch,  temperature, vibration  COORDINATION:   finger-nose-finger, fine finger movements normal  REFLEXES:   deep tendon reflexes present and symmetric  GAIT/STATION:   narrow based gait    DIAGNOSTIC DATA (LABS, IMAGING, TESTING) - I reviewed patient records, labs, notes, testing and imaging myself where available.  Lab Results  Component Value Date   WBC 6.9 02/09/2017   HGB 14.7 02/09/2017   HCT 43.4 02/09/2017   MCV 95.7 02/09/2017   PLT 266.0 02/09/2017      Component Value Date/Time   NA 139 02/09/2017 0946   K 4.2 02/09/2017 0946   CL 104 02/09/2017 0946   CO2 29 02/09/2017 0946   GLUCOSE 77 02/09/2017 0946   BUN 15 02/09/2017 0946   CREATININE 0.97 02/09/2017 0946   CALCIUM 9.8 02/09/2017 0946   PROT 6.8 02/09/2017 0946   ALBUMIN 4.4 02/09/2017 0946   AST 12 02/09/2017 0946   ALT 12 02/09/2017 0946   ALKPHOS 47 02/09/2017 0946   BILITOT 0.3 02/09/2017 0946   GFRNONAA >90 08/10/2014 2155   GFRAA >90  08/10/2014 2155   Lab Results  Component Value Date   CHOL 180 05/06/2016   HDL 40.80 05/06/2016   LDLCALC 129 (H) 05/06/2016   TRIG 52.0 05/06/2016   CHOLHDL 4 05/06/2016   Lab Results  Component Value Date   HGBA1C 6.1 05/06/2016   Lab Results  Component Value Date   VITAMINB12 395 05/06/2016   Lab Results  Component Value Date   TSH 0.40 05/06/2016     02/25/09 CT head [I reviewed images myself and agree with interpretation. -VRP]  - No acute intracranial findings or mass lesions.     ASSESSMENT AND PLAN  41 y.o. 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 Now patient having multiple severe headaches per day. He has tried multiple individual medications in the past without relief.   Meds tried and failed: verapamil (bruising), topiramate (not effective), sumatriptan nasal (not effective)   Dx:  Intractable episodic cluster headache  Hypertension, unspecified type     PLAN:  I spent 25 minutes of face to face time with patient. Greater than 50% of time was spent in counseling and coordination of care with patient. In summary we discussed:   CLUSTER HEADACHE PREVENTION: - continue prednisone taper (long-term corticosteroid side effects reviewed with patient) - melatonin supplement at night - discussed surgical treatment options such as DBS (hypothalamus), VNS, or SPG stimulation or other nerve ablation; will refer to [facility] Dr. Angelyn Punt for consideration of treatment options  CLUSTER HEAD RESCUE TREATMENT - tried to rx sumatriptan [place] injection for cluster headache rescue (patient has tried and failed oral and nasal sumatriptan) but insurance has not covered injections - use oxygen at home as needed  HEALTH PREVENTATIVE  - take prevacid or prilosec over the counter for stomach protection - take daily multi-vitamin for bone health (especially calcium and vitamin D)  Orders Placed This Encounter   Procedures  . Ambulatory referral to Neurosurgery   Return in about 6 months (around 12/22/2017).    Suanne Marker, MD 06/21/2017, 11:56 AM Certified in Neurology, Neurophysiology and Neuroimaging  [facility] [address], [address] [place], [place] [address] [phone]

## 2017-06-23 ENCOUNTER — Telehealth: Payer: Self-pay

## 2017-06-23 ENCOUNTER — Ambulatory Visit: Payer: Managed Care, Other (non HMO)

## 2017-06-23 VITALS — BP 136/90

## 2017-06-23 DIAGNOSIS — I10 Essential (primary) hypertension: Secondary | ICD-10-CM

## 2017-06-23 MED ORDER — VALSARTAN 320 MG PO TABS: 320.0000 mg | ORAL_TABLET | Freq: Every day | ORAL | 3 refills | Status: DC

## 2017-06-23 NOTE — Telephone Encounter (Signed)
Pt called back please call back  °

## 2017-06-23 NOTE — Telephone Encounter (Signed)
Left message for patient to call back to go over medication changes and dr Melvyn Novasjohns instructions, can talk with Delorus Langwell

## 2017-06-23 NOTE — Telephone Encounter (Signed)
Advised patient of dr Melvyn Novasjohns instructions, patient repeated back for understanding, will call back to schedule nurse visit closer to 10 day period

## 2017-06-23 NOTE — Addendum Note (Signed)
Addended by: Corwin LevinsJOHN, Lashante Fryberger W on: 06/23/2017 01:08 PM   Modules accepted: Orders

## 2017-06-23 NOTE — Telephone Encounter (Signed)
Patient came in (nurse visit) for bp recheck today---today's reading after being on bp med for appx 1 week is 136/90---routing to dr Jonny Ruizjohn, please advise if you want to make any other changes, I will call patient back, thanks

## 2017-06-23 NOTE — Telephone Encounter (Signed)
Ok to increase the diovan to 320 mg per day, with a goal average blood pressure being less than 130/80  Please return for Nurse Visit BP check in 7-10 days; also please have BMP at lab to make sure no high K or worsening kidney function

## 2017-06-28 ENCOUNTER — Telehealth: Payer: Self-pay | Admitting: Diagnostic Neuroimaging

## 2017-06-28 NOTE — Telephone Encounter (Signed)
Dr. Marjory LiesPenumalli , Please advise me . Dr. Jimmy Picketatter's office has declined referral . Victorino DikeJennifer his referral coordinator relayed the office has declined referral would say why.

## 2017-06-29 NOTE — Telephone Encounter (Signed)
Pt called to inform Dr Marjory LiesPenumalli of what Arma HeadingDana C stated in her entry.  Pt called today to inform that he is having an onset and the head ache is very unbearable.  Pt states his PCP took him out of work on 6-27 an he returned today.  Pt advised to use ED if need be, he is asking to be called with plan of action as soon as possible.

## 2017-06-29 NOTE — Telephone Encounter (Signed)
Pt would like this message sent with a high priority

## 2017-06-29 NOTE — Telephone Encounter (Signed)
I called and LMVM for Ald[place]licia, [place] at [facility] relating to surgical interventions for pts with cluster headaches.  [facility] do not do procedures relating to cluster headaches.

## 2017-06-29 NOTE — Telephone Encounter (Signed)
I spoke to Victorino DikeJennifer, [place]RN for Dr. Lucia GaskinsAhern and appt was made with pt for 07-06-17 at 1600.  Pt verbalized understanding.

## 2017-06-29 NOTE — Telephone Encounter (Signed)
Helmut Musterlicia called back LMVM that they do no surgery re: cluster headaches.  Recommended [facility] and [facility] as options.  Per Dr. Marjory LiesPenumalli,  See headache specialist here, Dr. Lucia GaskinsAhern.  Make sure pt understands that will see for consult relating for possible botox, and will not receive botox that same day.    I reiterated to pt, he verbalizes understanding.  He would like to know something soon.  Anytime is good, for him.  He was having bad day yesterday, when he gets these pains, he states he sometimes will have suicidal thoughts.  He was not having these today as we spoke.

## 2017-06-30 ENCOUNTER — Telehealth: Payer: Self-pay | Admitting: Internal Medicine

## 2017-06-30 DIAGNOSIS — R51 Headache: Principal | ICD-10-CM

## 2017-06-30 DIAGNOSIS — R519 Headache, unspecified: Secondary | ICD-10-CM

## 2017-06-30 NOTE — Telephone Encounter (Signed)
Pt called requesting an urgent referral to [facility] in [place], [place]. Can this be sent over for him? He is a [place] pt but mentioned that he saw Dr Jonny RuizJohn for these issues.

## 2017-06-30 NOTE — Telephone Encounter (Signed)
Ok, this is done 

## 2017-07-02 ENCOUNTER — Telehealth: Payer: Self-pay

## 2017-07-02 NOTE — Telephone Encounter (Signed)
Done

## 2017-07-02 NOTE — Telephone Encounter (Signed)
Patient states he is trying to paid for the additional days he was out through his short term.  Date range is the same as on the form already completed.  Patient could not tell me any different.  Patient asks for you to print current copy out and fax again.

## 2017-07-02 NOTE — Telephone Encounter (Signed)
Called pt.  Needing clarification on why another form needs to be filled out. (This was filled out and faxed on 06/10/17 same exact paper, documented under the media tab). Is a correction needed or whats the reason for duplicate form?

## 2017-07-06 ENCOUNTER — Institutional Professional Consult (permissible substitution): Payer: Self-pay | Admitting: Neurology

## 2017-07-20 ENCOUNTER — Telehealth: Payer: Self-pay | Admitting: Internal Medicine

## 2017-07-20 NOTE — Telephone Encounter (Signed)
I asked the patient if that was if and he said it was not.

## 2017-07-20 NOTE — Telephone Encounter (Signed)
From my understanding [name] did not do a letter for him. He only brought paperwork for his migrianes that he needed filled out during the only visit we had with him. I will forward this to JJ for his input.   [name] please advise.

## 2017-07-20 NOTE — Telephone Encounter (Signed)
Patient states [name] had written a letter for him to be out of work from 6/27 through 7/10.  I do not see this under letters.  Patient states he is needing a copy of this letter to take to his back.  Please advise.

## 2017-07-20 NOTE — Telephone Encounter (Signed)
Pt is referring to the [facility] paperwork that has been scanned under the media tab.

## 2017-07-20 NOTE — Telephone Encounter (Signed)
Ok for the letter off work for those days

## 2017-07-21 NOTE — Telephone Encounter (Signed)
Please inform the pt that the letter is ready for pick up.

## 2017-07-23 NOTE — Telephone Encounter (Signed)
Notified patient.

## 2017-08-19 ENCOUNTER — Other Ambulatory Visit: Payer: Self-pay | Admitting: Neurology

## 2017-08-19 DIAGNOSIS — J3489 Other specified disorders of nose and nasal sinuses: Secondary | ICD-10-CM

## 2017-08-29 ENCOUNTER — Other Ambulatory Visit: Payer: Managed Care, Other (non HMO)

## 2017-09-09 ENCOUNTER — Ambulatory Visit
Admission: RE | Admit: 2017-09-09 | Discharge: 2017-09-09 | Disposition: A | Discharge status: Home / Self Care | Payer: 59 | Source: Ambulatory Visit | Attending: Neurology | Admitting: Neurology

## 2017-09-09 DIAGNOSIS — J3489 Other specified disorders of nose and nasal sinuses: Secondary | ICD-10-CM

## 2017-09-09 MED ORDER — GADOBENATE DIMEGLUMINE 529 MG/ML IV SOLN
19.0000 mL | Freq: Once | INTRAVENOUS | Status: AC | PRN
  Administered 2017-09-09: 19 mL via INTRAVENOUS

## 2017-11-12 ENCOUNTER — Other Ambulatory Visit: Payer: Self-pay | Admitting: Occupational Medicine

## 2017-11-12 ENCOUNTER — Ambulatory Visit: Payer: Self-pay

## 2017-11-12 DIAGNOSIS — M79641 Pain in right hand: Secondary | ICD-10-CM

## 2017-11-12 DIAGNOSIS — M25531 Pain in right wrist: Secondary | ICD-10-CM

## 2017-11-30 ENCOUNTER — Other Ambulatory Visit: Payer: Self-pay

## 2017-11-30 ENCOUNTER — Encounter (HOSPITAL_COMMUNITY): Payer: Self-pay

## 2017-11-30 ENCOUNTER — Emergency Department (HOSPITAL_COMMUNITY)
Admission: EM | Admit: 2017-11-30 | Discharge: 2017-11-30 | Disposition: A | Discharge status: Home / Self Care | Payer: 59 | Attending: Physician Assistant | Admitting: Physician Assistant

## 2017-11-30 ENCOUNTER — Emergency Department (HOSPITAL_COMMUNITY): Payer: 59

## 2017-11-30 DIAGNOSIS — F17210 Nicotine dependence, cigarettes, uncomplicated: Secondary | ICD-10-CM | POA: Diagnosis not present

## 2017-11-30 DIAGNOSIS — I10 Essential (primary) hypertension: Secondary | ICD-10-CM | POA: Insufficient documentation

## 2017-11-30 DIAGNOSIS — K6289 Other specified diseases of anus and rectum: Secondary | ICD-10-CM | POA: Diagnosis not present

## 2017-11-30 LAB — CBC WITH DIFFERENTIAL/PLATELET
Basophils Absolute: 0 10*3/uL (ref 0.0–0.1)
Basophils Relative: 1 %
EOS ABS: 0.2 10*3/uL (ref 0.0–0.7)
Eosinophils Relative: 2 %
HCT: 40.6 % (ref 39.0–52.0)
Hemoglobin: 13.4 g/dL (ref 13.0–17.0)
Lymphocytes Relative: 41 %
Lymphs Abs: 2.7 10*3/uL (ref 0.7–4.0)
MCH: 30.9 pg (ref 26.0–34.0)
MCHC: 33 g/dL (ref 30.0–36.0)
MCV: 93.8 fL (ref 78.0–100.0)
MONO ABS: 0.6 10*3/uL (ref 0.1–1.0)
MONOS PCT: 8 %
NEUTROS ABS: 3.1 10*3/uL (ref 1.7–7.7)
Neutrophils Relative %: 48 %
PLATELETS: 281 10*3/uL (ref 150–400)
RBC: 4.33 MIL/uL (ref 4.22–5.81)
RDW: 14.5 % (ref 11.5–15.5)
WBC: 6.5 10*3/uL (ref 4.0–10.5)

## 2017-11-30 LAB — URINALYSIS, ROUTINE W REFLEX MICROSCOPIC
Bacteria, UA: NONE SEEN
Bilirubin Urine: NEGATIVE
Glucose, UA: NEGATIVE mg/dL
Ketones, ur: NEGATIVE mg/dL
Leukocytes, UA: NEGATIVE
Nitrite: NEGATIVE
Protein, ur: NEGATIVE mg/dL
Specific Gravity, Urine: >1.046 — ABNORMAL HIGH (ref 1.005–1.030)
Squamous Epithelial / LPF: NONE SEEN
WBC, UA: NONE SEEN WBC/hpf (ref 0–5)
pH: 5 (ref 5.0–8.0)

## 2017-11-30 MED ORDER — IOPAMIDOL (ISOVUE-300) INJECTION 61%
INTRAVENOUS | Status: AC
  Administered 2017-11-30: 100 mL
  Filled 2017-11-30: qty 75

## 2017-11-30 MED ORDER — HYDROCORTISONE 2.5 % RE CREA: TOPICAL_CREAM | RECTAL | 0 refills | Status: DC

## 2017-11-30 MED ORDER — IOPAMIDOL (ISOVUE-300) INJECTION 61%
INTRAVENOUS | Status: AC
  Filled 2017-11-30: qty 30

## 2017-11-30 NOTE — ED Triage Notes (Signed)
Pt reports rectal pain x 4 days. Hx of perirectal abscess and is afraid this may be the same. PT denies fever/chills

## 2017-11-30 NOTE — Discharge Instructions (Addendum)
Please read attached information. If you experience any new or worsening signs or symptoms please return to the emergency room for evaluation. Please follow-up with your primary care provider or specialist as discussed. Please use medication prescribed only as directed and discontinue taking if you have any concerning signs or symptoms.   °

## 2017-11-30 NOTE — ED Provider Notes (Signed)
Patient CARE signed by previous provider pending CT evaluation.  CT shows no evidence of recurrent perirectal abscess, he does have stool in the rectosigmoid region.  Patient also has urinary bladder wall thickening, no urinary complaints, no signs of urinary tract infection.  After discussing CT with patient he is very relieved, he additionally notes that prior to symptoms starting the night before he was having hard bowel movements that required straining.  Patient need to denies any infectious etiology.  I have low suspicion for perirectal abscess in this well-appearing patient.  He is given strict return precautions the event symptoms do not improve or worsen, he verbalized understanding and agreement to today's plan had no further questions or concerns at the time of discharge.  Results for orders placed or performed during the hospital encounter of 11/30/17  CBC with Differential  Result Value Ref Range   WBC 6.5 4.0 - 10.5 K/uL   RBC 4.33 4.22 - 5.81 MIL/uL   Hemoglobin 13.4 13.0 - 17.0 g/dL   HCT 40.940.6 81.139.0 - 91.452.0 %   MCV 93.8 78.0 - 100.0 fL   MCH 30.9 26.0 - 34.0 pg   MCHC 33.0 30.0 - 36.0 g/dL   RDW 78.214.5 95.611.5 - 21.315.5 %   Platelets 281 150 - 400 K/uL   Neutrophils Relative % 48 %   Neutro Abs 3.1 1.7 - 7.7 K/uL   Lymphocytes Relative 41 %   Lymphs Abs 2.7 0.7 - 4.0 K/uL   Monocytes Relative 8 %   Monocytes Absolute 0.6 0.1 - 1.0 K/uL   Eosinophils Relative 2 %   Eosinophils Absolute 0.2 0.0 - 0.7 K/uL   Basophils Relative 1 %   Basophils Absolute 0.0 0.0 - 0.1 K/uL  Urinalysis, Routine w reflex microscopic  Result Value Ref Range   Color, Urine STRAW (A) YELLOW   APPearance CLEAR CLEAR   Specific Gravity, Urine >1.046 (H) 1.005 - 1.030   pH 5.0 5.0 - 8.0   Glucose, UA NEGATIVE NEGATIVE mg/dL   Hgb urine dipstick SMALL (A) NEGATIVE   Bilirubin Urine NEGATIVE NEGATIVE   Ketones, ur NEGATIVE NEGATIVE mg/dL   Protein, ur NEGATIVE NEGATIVE mg/dL   Nitrite NEGATIVE NEGATIVE    Leukocytes, UA NEGATIVE NEGATIVE   RBC / HPF 0-5 0 - 5 RBC/hpf   WBC, UA NONE SEEN 0 - 5 WBC/hpf   Bacteria, UA NONE SEEN NONE SEEN   Squamous Epithelial / LPF NONE SEEN NONE SEEN   Mucus PRESENT    Dg Wrist Complete Right  Result Date: 11/12/2017 CLINICAL DATA:  Right hand and wrist pain and swelling following an injury today. EXAM: RIGHT WRIST - COMPLETE 3+ VIEW COMPARISON:  Right hand radiographs obtained at the same time. FINDINGS: Old, healed distal fifth metacarpal fracture. No acute fracture or dislocation. IMPRESSION: No acute fracture. Electronically Signed   By: Beckie SaltsSteven  Reid M.D.   On: 11/12/2017 16:13   Ct Pelvis W Contrast  Result Date: 11/30/2017 CLINICAL DATA:  41 year old male with history of perirectal abscess requiring surgery. Peri rectal pain for the past 4 days. Initial encounter. EXAM: CT PELVIS WITH CONTRAST TECHNIQUE: Multidetector CT imaging of the pelvis was performed using the standard protocol following the bolus administration of intravenous contrast. CONTRAST:  100mL ISOVUE-300 IOPAMIDOL (ISOVUE-300) INJECTION 61% COMPARISON:  08/10/2014 CT. FINDINGS: Urinary Tract: Circumferential urinary bladder wall thickening. This may be related to under distension although cystitis could cause a similar appearance. Limited evaluation of bladder contents secondary to lack of contrast filled  views. Bowel:  No evidence of recurrent perirectal abscess. Small number of colonic diverticular without inflammation. No inflammation surrounds the appendix. Moderate stool rectosigmoid region. Vascular/Lymphatic: Age advanced atherosclerotic changes iliac arteries with calcified and noncalcified plaque. No adenopathy. Reproductive:  No worrisome abnormality. Other:  No bowel containing inguinal herniae Musculoskeletal: No worrisome osseous abnormality. IMPRESSION: No evidence of recurrent perirectal abscess. Small number of colonic diverticula without inflammation. Moderate stool rectosigmoid  region. Circumferential urinary bladder wall thickening. This may be related to under distension although cystitis could cause a similar appearance. Age advanced atherosclerotic changes iliac arteries with calcified and noncalcified plaque. Electronically Signed   By: Lacy DuverneySteven  Olson M.D.   On: 11/30/2017 16:50   Dg Hand Complete Right  Result Date: 11/12/2017 CLINICAL DATA:  Right hand and wrist pain and swelling following an injury today. EXAM: RIGHT HAND - COMPLETE 3+ VIEW COMPARISON:  01/17/2006. FINDINGS: Old, healed fifth metacarpal neck fracture. No acute fracture or dislocation. IMPRESSION: No acute fracture.  Old, healed distal fifth metacarpal fracture. Electronically Signed   By: Beckie SaltsSteven  Reid M.D.   On: 11/12/2017 16:13    Vitals:   11/30/17 1337  BP: (!) 141/95  Pulse: 85  Resp: 16  Temp: 98.2 F (36.8 C)  SpO2: 99%     Eyvonne MechanicHedges, Shaliah Wann, PA-C 11/30/17 1849    Phillis HaggisMabe, Martha L, MD 11/30/17 1911

## 2017-11-30 NOTE — ED Provider Notes (Signed)
[facility] Provider Note   CSN: [id number] Arrival date & time: 11/30/17  1312     History   Chief Complaint No chief complaint on file.   HPI Paul Groomsnthony Prince is a 41 y.o. male.  HPI 41 year old American male past medical history significant for perirectal abscess presents to the emergency department today complaining of rectal pain.  Patient states the pain is been ongoing for 3 days.  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  States that the same pain happened 3 years ago when he was diagnosed with a perirectal abscess that required hospital admission and surgery.  Patient reports pain with defecation.  Denies any associated bloody stools.  He has not tried any over-the-counter medications for his symptoms.  Nothing makes better.  Denies any associated abdominal pain, urinary symptoms, testicular pain or swelling, nausea, vomiting, fevers, chills.  Patient denies any anal penetration.  Is a history of HIV. Past Medical History:  Diagnosis Date  . Chronic headache     Patient Active Problem List   Diagnosis Date Noted  . HTN (hypertension) 06/09/2017  . Atypical chest pain 02/09/2017  . Low back pain 08/04/2016  . Contact dermatitis 07/22/2016  . Episodic cluster headache, not intractable 07/22/2015  . Pilonidal cyst 06/13/2015  . Epistaxis 06/13/2015  . Routine general medical examination at a health care facility 10/23/2014  . Tobacco abuse 10/23/2014  . Cluster headache 10/23/2014    Past Surgical History:  Procedure Laterality Date  . EXAMINATION UNDER ANESTHESIA N/A 08/11/2014   Procedure: EXAM UNDER ANESTHESIA;  Surgeon: Liz MaladyBurke E Thompson, MD;  [facility];  Service: General;  Laterality: N/A;  . INCISION AND DRAINAGE PERIRECTAL ABSCESS  08/11/2014   Procedure: IRRIGATION AND DEBRIDEMENT PERIRECTAL ABSCESS;  Surgeon: Liz MaladyBurke E Thompson, MD;  Location: [facility] OR;  Service: General;;        Home Medications    Prior to Admission medications   Medication Sig Start Date End Date Taking? Authorizing Provider  Aspirin-Acetaminophen-Caffeine (GOODY HEADACHE PO) Take by mouth 2 (two) times daily.    [provider]  ibuprofen (ADVIL,MOTRIN) 800 MG tablet Take 1 tablet (800 mg total) by mouth every 8 (eight) hours as needed. 06/09/17   Corwin LevinsJohn, James W, MD  pantoprazole (PROTONIX) 40 MG tablet Take 1 tablet (40 mg total) by mouth 2 (two) times daily. Patient not taking: Reported on 06/21/2017 02/09/17   Myrlene Brokerrawford, Elizabeth A, MD  predniSONE (DELTASONE) 10 MG tablet Take 10 mg by mouth daily with breakfast. Taper (now on 3 tablets daily)    [provider]  PRESCRIPTION MEDICATION Oxygen (clear air for cluster headaches)    [provider]  valsartan (DIOVAN) 320 MG tablet Take 1 tablet (320 mg total) by mouth daily. 06/23/17   Corwin LevinsJohn, James W, MD    Family History Family History  Problem Relation Age of Onset  . Bipolar disorder Mother   . Heart attack Father     Social History Social History   Tobacco Use  . Smoking status: Current Every Day Smoker    Packs/day: 0.45    Years: 20.00    Pack years: 9.00  . Smokeless tobacco: Never Used  . Tobacco comment: 01/24/16 black and mild cigars (3-4 daily)  Substance Use Topics  . Alcohol use: Yes    Alcohol/week: 0.0 oz    Comment: weekends  . Drug use: No     Allergies   Verapamil  Review of Systems Review of Systems  Constitutional: Negative for chills and fever.  Gastrointestinal: Positive for rectal pain. Negative for abdominal pain, blood in stool, nausea and vomiting.  Genitourinary: Negative for dysuria, flank pain, frequency, hematuria and urgency.     Physical Exam Updated Vital Signs BP (!) 141/95 (BP Location: Right Arm)   Pulse 85   Temp 98.2 F (36.8 C) (Oral)   Resp 16   Wt 94.3 kg (208 lb)   SpO2 99%   BMI 28.21 kg/m   Physical Exam  Constitutional: He is  oriented to person, place, and time. He appears well-developed and well-nourished.  Non-toxic appearance. No distress.  HENT:  Head: Normocephalic and atraumatic.  Mouth/Throat: Oropharynx is clear and moist.  Eyes: Conjunctivae are normal. Pupils are equal, round, and reactive to light. Right eye exhibits no discharge. Left eye exhibits no discharge.  Neck: Normal range of motion. Neck supple.  Cardiovascular: Normal rate, regular rhythm and intact distal pulses.  Abdominal: Soft. Bowel sounds are normal. He exhibits no distension. There is no tenderness. There is no rigidity, no rebound, no guarding and no CVA tenderness.  Genitourinary:  Genitourinary Comments: Chaperone present for exam. Pt tolerated without difficulty. No external hemorrhoids or fissures noted.  Rectal pain noted with examination of the rectal vault.  There is no obvious perirectal abscess noted.  No erythema.  No external hemorrhoids noted..  No internal hemorrhoids noted. Soft brown stool noted in the rectal vault. No gross hematochezia or melena.  Not appreciate any areas of fluctuance around the rectum.   Musculoskeletal: Normal range of motion. He exhibits no tenderness.  Lymphadenopathy:    He has no cervical adenopathy.  Neurological: He is alert and oriented to person, place, and time.  Skin: Skin is warm and dry. Capillary refill takes less than 2 seconds. No rash noted.  Psychiatric: His behavior is normal. Judgment and thought content normal.  Nursing note and vitals reviewed.    ED Treatments / Results  Labs (all labs ordered are listed, but only abnormal results are displayed) Labs Reviewed  CBC WITH DIFFERENTIAL/PLATELET  I-STAT CHEM 8, ED    EKG  EKG Interpretation None       Radiology No results found.  Procedures Procedures (including critical care time)  Medications Ordered in ED Medications  iopamidol (ISOVUE-300) 61 % injection (not administered)  iopamidol (ISOVUE-300) 61 %  injection (100 mLs  Contrast Given 11/30/17 1615)     Initial Impression / Assessment and Plan / ED Course  I have reviewed the triage vital signs and the nursing notes.  Pertinent labs & imaging results that were available during my care of the patient were reviewed by me and considered in my medical decision making (see chart for details).     Patient resents to the ED for 3 days of rectal pain.  Patient history of perirectal abscess in the past that has required surgical admission and intervention.  The patient reports pain with palpation and defecation.  Denies any other associated symptoms.  She is afebrile in the ED.  No tachycardia or hypotension noted.  Lab work is reassuring.  No leukocytosis.  On examination he does have pain to the rectal vault with rectal exam.  No obvious erythema or area of fluctuance that would be concerning for an abscess.  No thrombosed hemorrhoids are noted.  Patient has no prostate tenderness.  No urinary symptoms.  Doubt prostatitis.  Pain is more at the opening of the rectum.  Given history of rectal abscess and states that pain feels similar will order CT pelvis to rule out any perirectal abscess.  CT is pending at this time.  If CT does not show any signs of abscess patient can be treated with Anusol and sitz baths for possible hemorrhoids.  Care handoff to PA hedges. Pt has pending at this time CT scan.  Disposition likely home pending lab and test results. Care dicussed and plan agreed upon with oncoming PA. Pt updated on plan of care and is currently hemodynamically stable at this time with normal vs.    Final Clinical Impressions(s) / ED Diagnoses   Final diagnoses:  Rectal pain    ED Discharge Orders    None       Wallace KellerLeaphart, Liliyana Thobe T, PA-C 11/30/17 1649    Gerhard MunchLockwood, Robert, MD 12/03/17 2116

## 2018-12-22 IMAGING — MR MR HEAD WO/W CM
12 series · 48 of 48 positions shown · IV contrast (19ml multihance)
Comparison: Head CT 02/25/2009

CLINICAL DATA: Cluster headaches

EXAM:
MRI HEAD WITHOUT AND WITH CONTRAST
TECHNIQUE: Multiplanar, multiecho pulse sequences of the brain and surrounding
structures were obtained without and with intravenous contrast.
CONTRAST:  19mL MULTIHANCE GADOBENATE DIMEGLUMINE 529 MG/ML IV SOLN

[Series 2: T1 · sagittal · 5.0mm · 0.45mm/px · 1 of 21 slices shown]
[im 1/21]
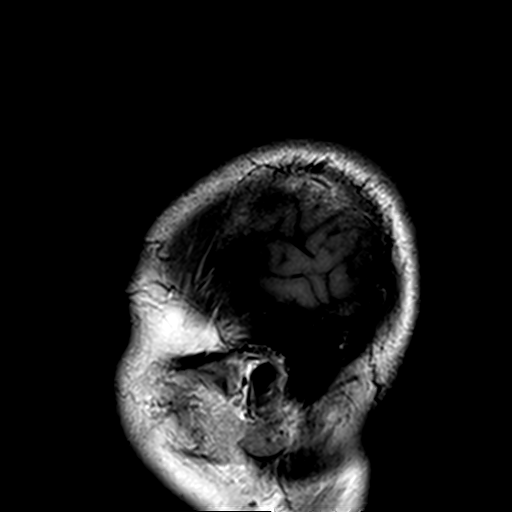

[Series 3: T2 · axial · 5.0mm · 0.51mm/px · 1 of 22 slices shown (1 of 2)]
[im 1/22]
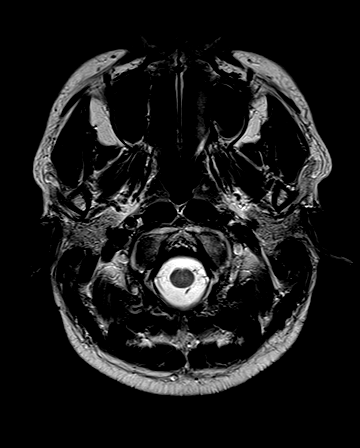

[Series 4: DWI · axial · 3.0mm · 1.80mm/px · z∈[-69,+72]mm · 6 of 96 slices shown (1 of 4)]
[im 1/96]
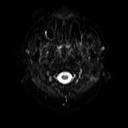
[im 20/96]
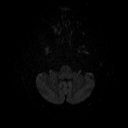
[im 39/96]
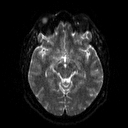
[im 58/96]
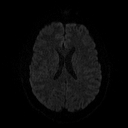
[im 77/96]
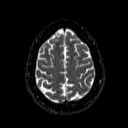
[im 96/96]
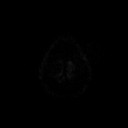

[Series 5: DWI · axial · 3.0mm · 1.80mm/px · z∈[-69,+72]mm · 3 of 47 slices shown (2 of 4)]
[im 1/47]
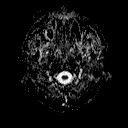
[im 24/47]
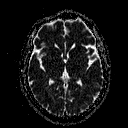
[im 47/47]
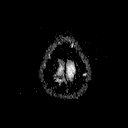

[Series 6: DWI · coronal · 5.0mm · 1.80mm/px · 4 of 67 slices shown (3 of 4)]
[im 1/67]
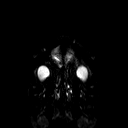
[im 23/67]
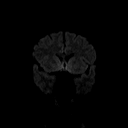
[im 45/67]
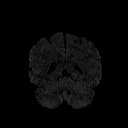
[im 67/67]
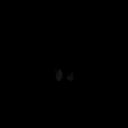

[Series 7: DWI · coronal · 5.0mm · 1.80mm/px · 2 of 34 slices shown (4 of 4)]
[im 1/34]
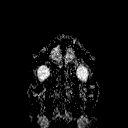
[im 34/34]
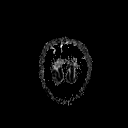

[Series 9: swi_images · axial · 2.0mm · 0.90mm/px · z∈[-69,+73]mm · 5 of 72 slices shown]
[im 1/72]
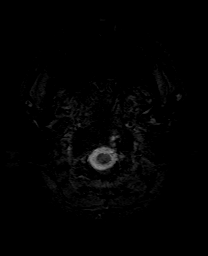
[im 18/72]
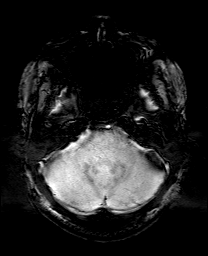
[im 36/72]
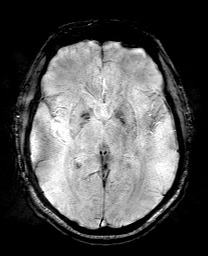
[im 54/72]
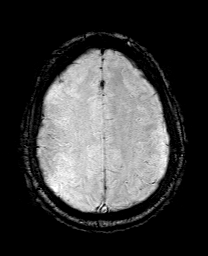
[im 72/72]
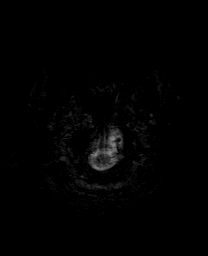

[Series 10: FLAIR · axial · 3.0mm · 0.45mm/px · z∈[-68,+72]mm · 2 of 30 slices shown]
[im 1/30]
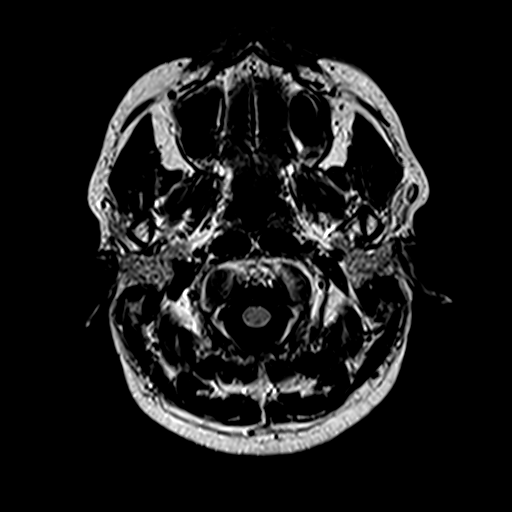
[im 30/30]
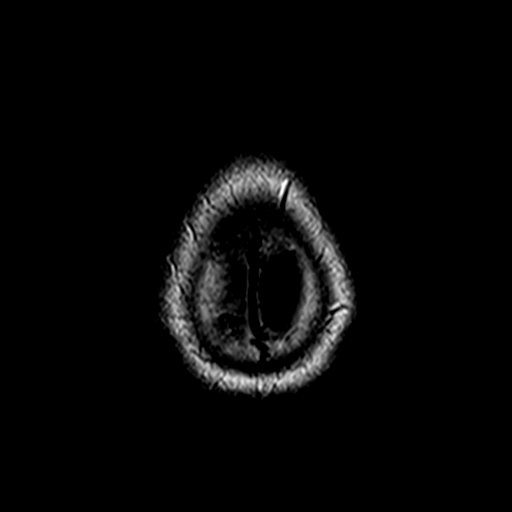

[Series 11: t1_mpr_tra · axial · 1.0mm · 0.75mm/px · z∈[-70,+73]mm · 10 of 144 slices shown (1 of 2)]
[im 1/144]
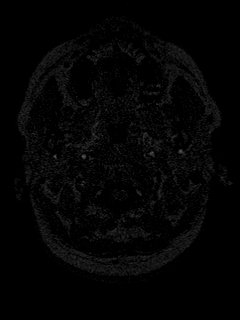
[im 16/144]
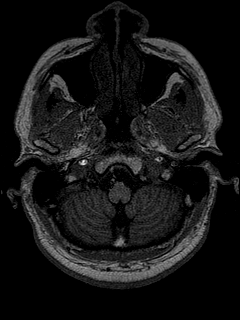
[im 32/144]
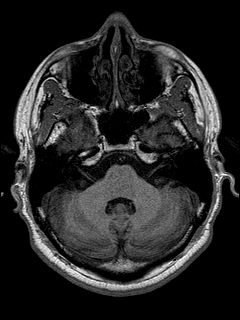
[im 48/144]
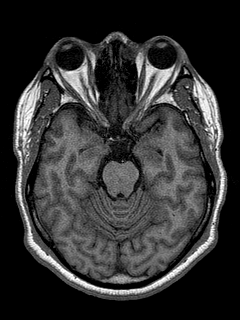
[im 64/144]
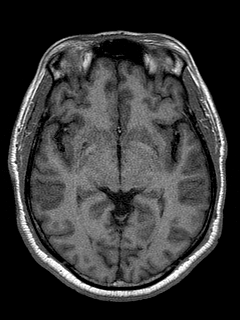
[im 80/144]
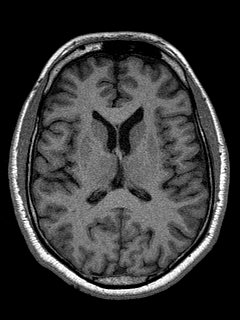
[im 96/144]
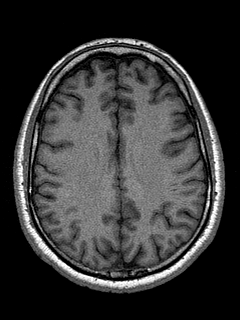
[im 112/144]
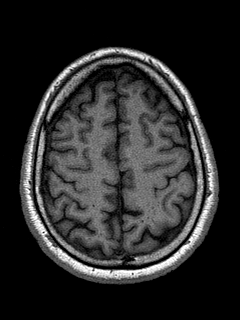
[im 128/144]
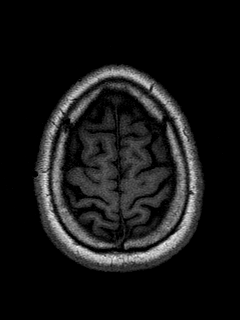
[im 144/144]
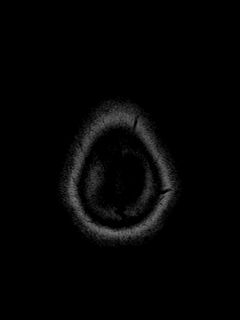

[Series 12: T2 · coronal · 5.0mm · 0.45mm/px · 2 of 25 slices shown (2 of 2)]
[im 1/25]
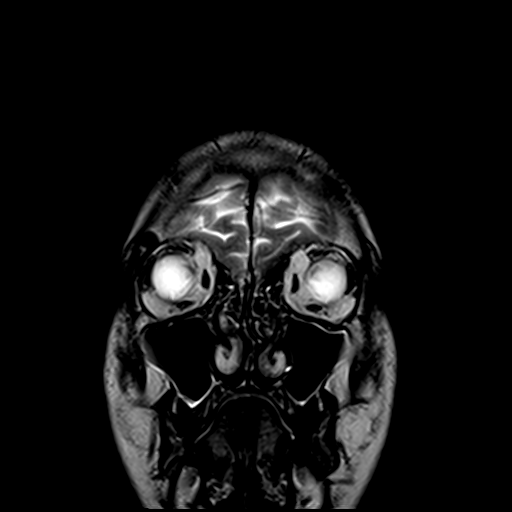
[im 25/25]
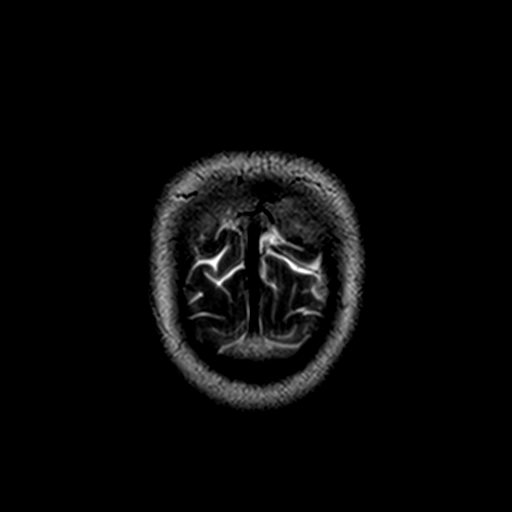

[Series 13: t1_mpr_tra · axial · 1.0mm · 0.75mm/px · z∈[-70,+73]mm · 10 of 144 slices shown (2 of 2)]
[im 1/144]
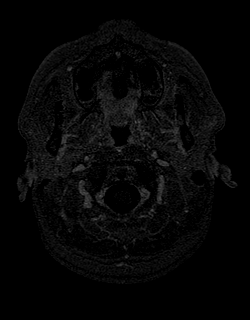
[im 16/144]
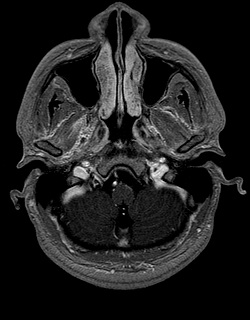
[im 32/144]
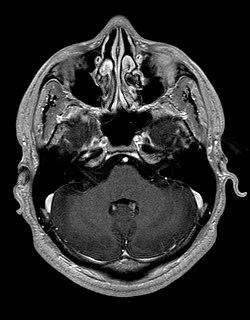
[im 48/144]
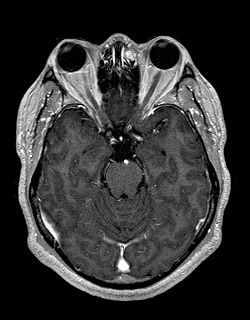
[im 64/144]
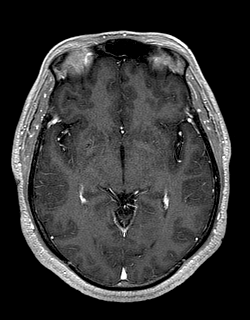
[im 80/144]
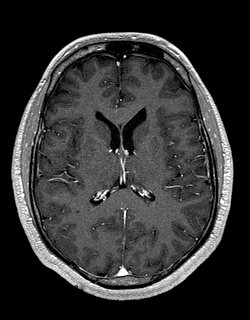
[im 96/144]
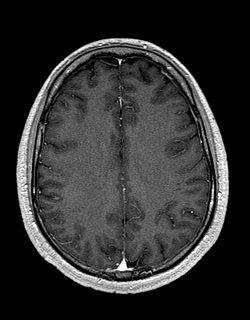
[im 112/144]
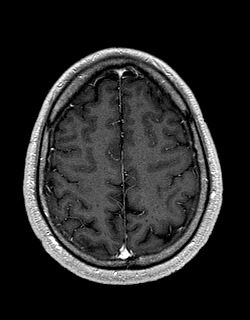
[im 128/144]
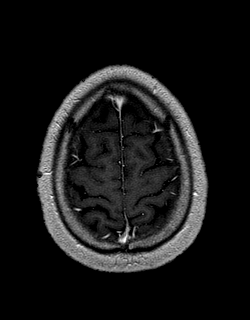
[im 144/144]
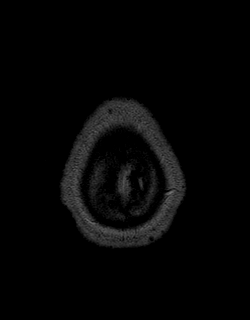

[Series 14: post cor · coronal · 5.0mm · 0.45mm/px · 2 of 25 slices shown]
[im 1/25]
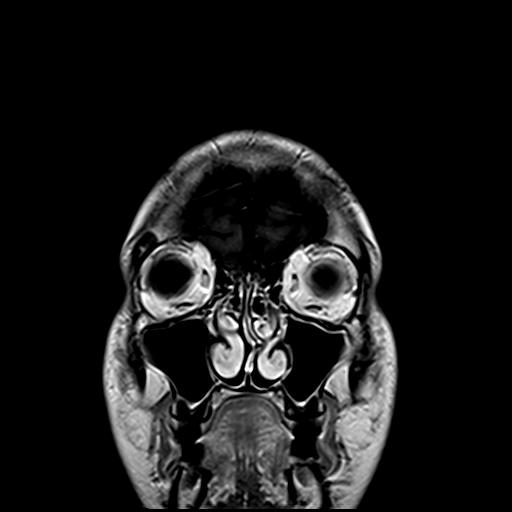
[im 25/25]
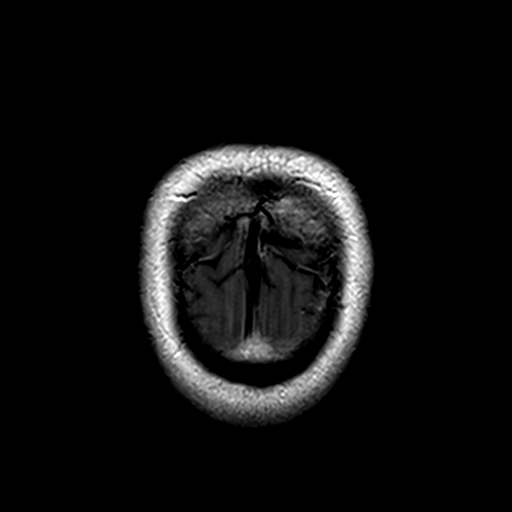

[48 of 48 positions shown; findings below may reference images not displayed]

FINDINGS: Brain: The midline structures are normal. There is no focal
diffusion restriction to indicate acute infarct. The brain
parenchymal signal is normal and there is no mass lesion. No
intraparenchymal hematoma or chronic microhemorrhage. Brain volume
is normal for age without lobar predominant atrophy. The dura is
normal and there is no extra-axial collection.

Vascular: Major intracranial arterial and venous sinus flow voids
are preserved.

Skull and upper cervical spine: The visualized skull base,
calvarium, upper cervical spine and extracranial soft tissues are
normal.

Sinuses/Orbits: No fluid levels or advanced mucosal thickening. No
mastoid or middle ear effusion. Normal orbits.
IMPRESSION: Normal brain MRI.

## 2019-02-24 ENCOUNTER — Ambulatory Visit (INDEPENDENT_AMBULATORY_CARE_PROVIDER_SITE_OTHER): Payer: 59 | Admitting: Internal Medicine

## 2019-02-24 ENCOUNTER — Encounter: Payer: Self-pay | Admitting: Internal Medicine

## 2019-02-24 ENCOUNTER — Other Ambulatory Visit: Payer: Self-pay

## 2019-02-24 DIAGNOSIS — F322 Major depressive disorder, single episode, severe without psychotic features: Secondary | ICD-10-CM | POA: Diagnosis not present

## 2019-02-24 DIAGNOSIS — B009 Herpesviral infection, unspecified: Secondary | ICD-10-CM | POA: Diagnosis not present

## 2019-02-24 MED ORDER — ESCITALOPRAM OXALATE 10 MG PO TABS: 10.0000 mg | ORAL_TABLET | Freq: Every day | ORAL | 3 refills | Status: DC

## 2019-02-24 MED ORDER — VALACYCLOVIR HCL 1 G PO TABS: 1000.0000 mg | ORAL_TABLET | Freq: Every day | ORAL | 3 refills | Status: DC

## 2019-02-24 NOTE — Assessment & Plan Note (Signed)
Rx for valtrex today. Counseling.

## 2019-02-24 NOTE — Assessment & Plan Note (Signed)
Rx for lexapro given severity of symptoms and given list of counselors to help with coping skills and counseling. He is not having active SI just some rare passive in the past. He knows to call or come back if this changes.

## 2019-02-24 NOTE — Patient Instructions (Addendum)
We have sent in valtrex to use to prevent outbreaks of herpes. You can take 1 pill daily to prevent symptoms.  You can call a therapist to see them, your insurance will cover any that you want to see.   We have sent in lexapro to take 1 pill daily which will take effect in 2-4 weeks for the depression to help boost things.
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
## 2019-11-19 ENCOUNTER — Ambulatory Visit (HOSPITAL_COMMUNITY)
Admission: EM | Admit: 2019-11-19 | Discharge: 2019-11-19 | Disposition: A | Discharge status: Home / Self Care | Payer: 59 | Attending: Urgent Care | Admitting: Urgent Care

## 2019-11-19 ENCOUNTER — Encounter (HOSPITAL_COMMUNITY): Payer: Self-pay

## 2019-11-19 ENCOUNTER — Other Ambulatory Visit: Payer: Self-pay

## 2019-11-19 DIAGNOSIS — R319 Hematuria, unspecified: Secondary | ICD-10-CM

## 2019-11-19 DIAGNOSIS — G8929 Other chronic pain: Secondary | ICD-10-CM | POA: Insufficient documentation

## 2019-11-19 DIAGNOSIS — R519 Headache, unspecified: Secondary | ICD-10-CM | POA: Insufficient documentation

## 2019-11-19 DIAGNOSIS — G44009 Cluster headache syndrome, unspecified, not intractable: Secondary | ICD-10-CM | POA: Diagnosis present

## 2019-11-19 LAB — POCT URINALYSIS DIP (DEVICE)
Bilirubin Urine: NEGATIVE
Glucose, UA: NEGATIVE mg/dL
Ketones, ur: NEGATIVE mg/dL
Leukocytes,Ua: NEGATIVE
Nitrite: NEGATIVE
Protein, ur: NEGATIVE mg/dL
Specific Gravity, Urine: >=1.03 (ref 1.005–1.030)
Urobilinogen, UA: 0.2 mg/dL (ref 0.0–1.0)
pH: 5.5 (ref 5.0–8.0)

## 2019-11-19 LAB — BASIC METABOLIC PANEL
Anion gap: 8 (ref 5–15)
BUN: 11 mg/dL (ref 6–20)
CO2: 26 mmol/L (ref 22–32)
Calcium: 9.4 mg/dL (ref 8.9–10.3)
Chloride: 105 mmol/L (ref 98–111)
Creatinine, Ser: 0.95 mg/dL (ref 0.61–1.24)
GFR calc Af Amer: >60 mL/min (ref >60)
GFR calc non Af Amer: >60 mL/min (ref >60)
Glucose, Bld: 81 mg/dL (ref 70–99)
Potassium: 4.3 mmol/L (ref 3.5–5.1)
Sodium: 139 mmol/L (ref 135–145)

## 2019-11-19 NOTE — Discharge Instructions (Addendum)
Hydrate well with at least 2 liters (64 ounces) of water daily. Do your best to continue to refill your cluster headache medication and stop using Goody Powders. You may take 500mg -650mg  Tylenol every 6 hours for pain and inflammation.

## 2019-11-19 NOTE — ED Provider Notes (Signed)
[facility]   MRN: [id number] DOB: 12/03/1976  Subjective:   Paul Prince is a 43 y.o. male presenting for an episode of frank painless hematuria this morning.  Patient denies history of kidney disease, kidney stones.  He does have a history of chronic headaches, cluster headaches and has been using Goody powders 3 or 4 times every day.  Has been unable to get his regular headache medication as its not covered by his insurance and has had difficulty following up with his PCP, neurologist.  He also does not hydrate very well.  States that he rarely drinks water, usually drinks coffee and soda.  He is a smoker as well.  No current facility-administered medications for this encounter.   Current Outpatient Medications:  .  EMGALITY, 300 MG DOSE, 100 MG/ML SOSY, , Disp: , Rfl:  .  escitalopram (LEXAPRO) 10 MG tablet, Take 1 tablet (10 mg total) by mouth daily., Disp: 90 tablet, Rfl: 3 .  pantoprazole (PROTONIX) 40 MG tablet, Take 1 tablet (40 mg total) by mouth 2 (two) times daily. (Patient not taking: Reported on 06/21/2017), Disp: 60 tablet, Rfl: 0 .  PRESCRIPTION MEDICATION, Oxygen (clear air for cluster headaches), Disp: , Rfl:  .  valACYclovir (VALTREX) 1000 MG tablet, Take 1 tablet (1,000 mg total) by mouth daily., Disp: 90 tablet, Rfl: 3 .  valsartan (DIOVAN) 320 MG tablet, Take 1 tablet (320 mg total) by mouth daily. (Patient not taking: Reported on 02/24/2019), Disp: 90 tablet, Rfl: 3   Allergies  Allergen Reactions  . Verapamil     Stated causing bruising    Past Medical History:  Diagnosis Date  . Chronic headache      Past Surgical History:  Procedure Laterality Date  . EXAMINATION UNDER ANESTHESIA N/A 08/11/2014   Procedure: EXAM UNDER ANESTHESIA;  Surgeon: Zenovia Jarred, MD;  Location: [facility];  Service: General;  Laterality: N/A;  . INCISION AND DRAINAGE PERIRECTAL ABSCESS  08/11/2014   Procedure: IRRIGATION AND DEBRIDEMENT PERIRECTAL ABSCESS;  Surgeon: Zenovia Jarred, MD;  Location: [facility];  Service: General;;    Family History  Problem Relation Age of Onset  . Bipolar disorder Mother   . Heart attack Father     Social History   Tobacco Use  . Smoking status: Current Every Day Smoker    Packs/day: 0.45    Years: 20.00    Pack years: 9.00  . Smokeless tobacco: Never Used  . Tobacco comment: 01/24/16 black and mild cigars (3-4 daily)  Substance Use Topics  . Alcohol use: Yes    Alcohol/week: 0.0 standard drinks    Comment: weekends  . Drug use: No    Review of Systems  Constitutional: Negative for chills, diaphoresis, fever and malaise/fatigue.  Eyes: Negative for blurred vision.  Respiratory: Negative for shortness of breath.   Cardiovascular: Negative for chest pain.  Gastrointestinal: Negative for abdominal pain, blood in stool, diarrhea, nausea and vomiting.  Genitourinary: Positive for hematuria. Negative for dysuria, flank pain, frequency and urgency.  Musculoskeletal: Negative for back pain and myalgias.  Skin: Negative for rash.  Neurological: Negative for dizziness, weakness and headaches.  Psychiatric/Behavioral: Negative for substance abuse.     Objective:   Vitals: BP (!) 144/85 (BP Location: Left Arm)   Pulse 65   Temp 98.6 F (37 C) (Oral)   Resp 18   SpO2 100%   Physical Exam Constitutional:      General: He is not in acute distress.  Appearance: Normal appearance. He is well-developed. He is not ill-appearing, toxic-appearing or diaphoretic.  HENT:     Head: Normocephalic and atraumatic.     Right Ear: External ear normal.     Left Ear: External ear normal.     Nose: Nose normal.     Mouth/Throat:     Mouth: Mucous membranes are moist.     Pharynx: Oropharynx is clear.  Eyes:     General: No scleral icterus.    Extraocular Movements: Extraocular movements intact.     Pupils: Pupils are equal, round, and reactive to light.  Cardiovascular:     Rate and Rhythm: Normal rate and regular rhythm.      Heart sounds: Normal heart sounds. No murmur. No friction rub. No gallop.   Pulmonary:     Effort: Pulmonary effort is normal. No respiratory distress.     Breath sounds: Normal breath sounds. No stridor. No wheezing, rhonchi or rales.  Abdominal:     General: Bowel sounds are normal. There is no distension.     Palpations: Abdomen is soft. There is no mass.     Tenderness: There is no abdominal tenderness. There is no guarding or rebound.  Skin:    General: Skin is warm and dry.  Neurological:     Mental Status: He is alert and oriented to person, place, and time.  Psychiatric:        Mood and Affect: Mood normal.        Behavior: Behavior normal.        Thought Content: Thought content normal.        Judgment: Judgment normal.     Results for orders placed or performed during the hospital encounter of 11/19/19 (from the past 24 hour(s))  POCT urinalysis dip (device)     Status: Abnormal   Collection Time: 11/19/19 12:08 PM  Result Value Ref Range   Glucose, UA NEGATIVE NEGATIVE mg/dL   Bilirubin Urine NEGATIVE NEGATIVE   Ketones, ur NEGATIVE NEGATIVE mg/dL   Specific Gravity, Urine >=1.030 1.005 - 1.030   Hgb urine dipstick SMALL (A) NEGATIVE   pH 5.5 5.0 - 8.0   Protein, ur NEGATIVE NEGATIVE mg/dL   Urobilinogen, UA 0.2 0.0 - 1.0 mg/dL   Nitrite NEGATIVE NEGATIVE   Leukocytes,Ua NEGATIVE NEGATIVE    Assessment and Plan :   1. Hematuria, unspecified type   2. Cluster headache, not intractable, unspecified chronicity pattern   3. Chronic nonintractable headache, unspecified headache type     Vital signs and physical exam findings reassuring and stable for discharge.  Counseled on differential which includes mild passage of renal stone, AKI, also discussed consideration for malignancy in light of his smoking and painless hematuria.  Labs are pending.  Will redirect patient to the ER if his labs are consistent with AKI.  In the meantime emphasize patient stop using Goody  powders, hydrate much better with water and cut back on his coffee and soda.  We will have patient use Tylenol instead and continue efforts to get his medication for his headaches refilled.  Follow-up with PCP ASAP. Counseled patient on potential for adverse effects with medications prescribed/recommended today, ER and return-to-clinic precautions discussed, patient verbalized understanding.    Wallis Bamberg, PA-C 11/19/19 1240

## 2019-11-19 NOTE — ED Triage Notes (Signed)
Pt present blood in his urine, symptoms started yesterday. Pt states he used the bathroom 1x and notice blood in his urine.

## 2019-11-23 ENCOUNTER — Other Ambulatory Visit: Payer: Self-pay

## 2019-11-23 ENCOUNTER — Encounter: Payer: Self-pay | Admitting: Internal Medicine

## 2019-11-23 ENCOUNTER — Ambulatory Visit (INDEPENDENT_AMBULATORY_CARE_PROVIDER_SITE_OTHER): Payer: 59 | Admitting: Internal Medicine

## 2019-11-23 ENCOUNTER — Other Ambulatory Visit (INDEPENDENT_AMBULATORY_CARE_PROVIDER_SITE_OTHER): Payer: 59

## 2019-11-23 VITALS — BP 130/80 | HR 81 | Temp 98.9°F | Ht 72.0 in | Wt 200.0 lb

## 2019-11-23 DIAGNOSIS — R319 Hematuria, unspecified: Secondary | ICD-10-CM | POA: Diagnosis not present

## 2019-11-23 LAB — URINALYSIS, ROUTINE W REFLEX MICROSCOPIC
Bilirubin Urine: NEGATIVE
Ketones, ur: NEGATIVE
Leukocytes,Ua: NEGATIVE
Nitrite: NEGATIVE
Specific Gravity, Urine: 1.025 (ref 1.000–1.030)
Total Protein, Urine: NEGATIVE
Urine Glucose: NEGATIVE
Urobilinogen, UA: 4 — AB (ref 0.0–1.0)
pH: 6.5 (ref 5.0–8.0)

## 2019-11-23 NOTE — Patient Instructions (Signed)
Try to drink more water to help and let us know if you have more blood in the urine.   If this happens again we will need to check into it more.

## 2019-11-23 NOTE — Progress Notes (Signed)
   Subjective:   Patient ID: Paul Prince, male    DOB: 1976-11-28, 43 y.o.   MRN: [id number]  HPI The patient is a 43 YO man coming in for urgent care follow up. Had an episode of blood in urine and some spotting in the underwear around the same time. Went to urgent care and U/A with small hematuria and no infection signs. No imaging was done. BMP was normal. He is a smoker. Denies weight change or loss. Denies urinary issues such as burning, hesitancy, straining, nocturia. 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 Denies fevers or chills. There were no clots passed that he saw.   PMH, [facility], social history reviewed and updated.   Review of Systems  Constitutional: Negative.   HENT: Negative.   Eyes: Negative.   Respiratory: Negative for cough, chest tightness and shortness of breath.   Cardiovascular: Negative for chest pain, palpitations and leg swelling.  Gastrointestinal: Negative for abdominal distention, abdominal pain, constipation, diarrhea, nausea and vomiting.  Musculoskeletal: Negative.   Skin: Negative.   Neurological: Negative.   Psychiatric/Behavioral: Negative.     Objective:  Physical Exam Constitutional:      Appearance: He is well-developed.  HENT:     Head: Normocephalic and atraumatic.  Cardiovascular:     Rate and Rhythm: Normal rate and regular rhythm.  Pulmonary:     Effort: Pulmonary effort is normal. No respiratory distress.     Breath sounds: Normal breath sounds. No wheezing or rales.  Abdominal:     General: Bowel sounds are normal. There is no distension.     Palpations: Abdomen is soft.     Tenderness: There is no abdominal tenderness. There is no rebound.  Musculoskeletal:     Cervical back: Normal range of motion.  Skin:    General: Skin is warm and dry.  Neurological:     Mental Status: He is alert and oriented to person, place, and time.     Coordination: Coordination normal.     Vitals:   11/23/19 1545  BP: 130/80  Pulse:  81  Temp: 98.9 F (37.2 C)  TempSrc: Oral  SpO2: 96%  Weight: 200 lb (90.7 kg)  Height: 6' (1.829 m)    This visit occurred during the SARS-CoV-2 public health emergency.  Safety protocols were in place, including screening questions prior to the visit, additional usage of staff PPE, and extensive cleaning of exam room while observing appropriate contact time as indicated for disinfecting solutions.   Assessment & Plan:  Visit time 25 minutes: greater than 50% of that time was spent in face to face counseling and coordination of care with the patient: counseled about need for follow up, potential etiology, need to quit smoking

## 2019-11-24 ENCOUNTER — Other Ambulatory Visit: Payer: Self-pay | Admitting: Internal Medicine

## 2019-11-24 DIAGNOSIS — R319 Hematuria, unspecified: Secondary | ICD-10-CM | POA: Insufficient documentation

## 2019-11-24 NOTE — Assessment & Plan Note (Addendum)
Checking U/A today and if persistent hematuria may benefit from referral to urology for evaluation versus recheck in 4-6 weeks. If persistently positive needs referral to urology. No pain making kidney stone less likely.

## 2021-07-22 ENCOUNTER — Other Ambulatory Visit: Payer: Self-pay

## 2021-07-22 ENCOUNTER — Encounter: Payer: Self-pay | Admitting: Internal Medicine

## 2021-07-22 ENCOUNTER — Ambulatory Visit (INDEPENDENT_AMBULATORY_CARE_PROVIDER_SITE_OTHER): Payer: 59 | Admitting: Internal Medicine

## 2021-07-22 VITALS — BP 128/90 | HR 90 | Temp 98.9°F | Resp 18 | Ht 72.0 in | Wt 203.8 lb

## 2021-07-22 DIAGNOSIS — L02214 Cutaneous abscess of groin: Secondary | ICD-10-CM | POA: Diagnosis not present

## 2021-07-22 DIAGNOSIS — Z125 Encounter for screening for malignant neoplasm of prostate: Secondary | ICD-10-CM | POA: Diagnosis not present

## 2021-07-22 MED ORDER — SULFAMETHOXAZOLE-TRIMETHOPRIM 800-160 MG PO TABS: 1.0000 | ORAL_TABLET | Freq: Two times a day (BID) | ORAL | 0 refills | Status: DC

## 2021-07-22 MED ORDER — VALACYCLOVIR HCL 1 G PO TABS: 1000.0000 mg | ORAL_TABLET | Freq: Every day | ORAL | 3 refills | Status: DC

## 2021-07-22 MED ORDER — TADALAFIL 5 MG PO TABS: 5.0000 mg | ORAL_TABLET | Freq: Every day | ORAL | 11 refills | Status: DC

## 2021-07-22 NOTE — Patient Instructions (Signed)
We have sent in the bactrim to take 1 pill twice a day for 1 week to clear up the spot. If this does not work let us know.

## 2021-07-22 NOTE — Progress Notes (Signed)
   Subjective:   Patient ID: Paul Prince, male    DOB: 1976-10-03, 45 y.o.   MRN: [id number]  HPI The patient is a 45 YO man coming in for abscess on the right side inguinal region. No penile drainage or burning or pain. No new sexual partners.   Review of Systems  Constitutional: Negative.   HENT: Negative.    Eyes: Negative.   Respiratory:  Negative for cough, chest tightness and shortness of breath.   Cardiovascular:  Negative for chest pain, palpitations and leg swelling.  Gastrointestinal:  Negative for abdominal distention, abdominal pain, constipation, diarrhea, nausea and vomiting.  Musculoskeletal: Negative.   Skin:  Positive for wound.  Neurological: Negative.   Psychiatric/Behavioral: Negative.     Objective:  Physical Exam Constitutional:      Appearance: He is well-developed.  HENT:     Head: Normocephalic and atraumatic.  Cardiovascular:     Rate and Rhythm: Normal rate and regular rhythm.  Pulmonary:     Effort: Pulmonary effort is normal. No respiratory distress.     Breath sounds: Normal breath sounds. No wheezing or rales.  Abdominal:     General: Bowel sounds are normal. There is no distension.     Palpations: Abdomen is soft.     Tenderness: There is no abdominal tenderness. There is no rebound.  Musculoskeletal:     Cervical back: Normal range of motion.  Skin:    General: Skin is warm and dry.     Comments: Abscess right inguinal region with some spontaneous drainage with some regional LAD. Penis without lesion or redness.   Neurological:     Mental Status: He is alert and oriented to person, place, and time.     Coordination: Coordination normal.    Vitals:   07/22/21 1533  BP: 128/90  Pulse: 90  Resp: 18  Temp: 98.9 F (37.2 C)  TempSrc: Oral  SpO2: 99%  Weight: 203 lb 12.8 oz (92.4 kg)  Height: 6' (1.829 m)    This visit occurred during the SARS-CoV-2 public health emergency.  Safety protocols were in place, including screening  questions prior to the visit, additional usage of staff PPE, and extensive cleaning of exam room while observing appropriate contact time as indicated for disinfecting solutions.   Assessment & Plan:

## 2021-07-25 DIAGNOSIS — L02214 Cutaneous abscess of groin: Secondary | ICD-10-CM | POA: Insufficient documentation

## 2021-07-25 NOTE — Assessment & Plan Note (Signed)
Rx bactrim. Does not have symptoms of STI offered screening which he declines.

## 2021-07-30 ENCOUNTER — Telehealth: Payer: Self-pay | Admitting: Internal Medicine

## 2021-07-30 NOTE — Telephone Encounter (Signed)
   Patient calling to request prior auth be done for tadalafil (CIALIS) 5 MG tablet

## 2021-07-30 NOTE — Telephone Encounter (Signed)
Prior authorization has been completed for Cialis. Key: [id number] PA Case ID: [id number]  Patient was also signed up to receive mobile updates about his pending authorization.  Awaiting determination from insurance.

## 2021-08-15 ENCOUNTER — Telehealth: Payer: Self-pay

## 2021-08-15 NOTE — Telephone Encounter (Signed)
PA Started for Tadalafil 5MG  tablets Key: [facility]

## 2022-02-09 ENCOUNTER — Encounter (HOSPITAL_COMMUNITY): Payer: Self-pay

## 2022-02-09 ENCOUNTER — Emergency Department (HOSPITAL_COMMUNITY)
Admission: EM | Admit: 2022-02-09 | Discharge: 2022-02-09 | Disposition: A | Discharge status: Home / Self Care | Payer: 59 | Attending: Emergency Medicine | Admitting: Emergency Medicine

## 2022-02-09 ENCOUNTER — Other Ambulatory Visit: Payer: Self-pay

## 2022-02-09 DIAGNOSIS — H02844 Edema of left upper eyelid: Secondary | ICD-10-CM | POA: Diagnosis present

## 2022-02-09 DIAGNOSIS — H00014 Hordeolum externum left upper eyelid: Secondary | ICD-10-CM | POA: Insufficient documentation

## 2022-02-09 MED ORDER — POLYMYXIN B-TRIMETHOPRIM 10000-0.1 UNIT/ML-% OP SOLN: 1.0000 [drp] | Freq: Four times a day (QID) | OPHTHALMIC | 0 refills | Status: AC

## 2022-02-09 NOTE — Discharge Instructions (Addendum)
Recommend warm compresses to the upper eyelid every several hours to help with inflammation.

## 2022-02-09 NOTE — ED Provider Notes (Signed)
[facility] DEPT Provider Note   CSN: [id number] Arrival date & time: 02/09/22  [id number]     History  Chief Complaint  Patient presents with   Eye Problem    Paul Prince is a 46 y.o. male.  Here with swelling to his left upper eyelid.  History of styes.  Gotten worse over the last several days.  Minimal pain.   Eye Problem Location:  Left eye Severity:  Mild Onset quality:  Gradual Timing:  Constant Progression:  Worsening Chronicity:  Recurrent Context: not direct trauma   Relieved by:  Nothing Worsened by:  Nothing Associated symptoms: no blurred vision, no crusting, no decreased vision, no discharge, no double vision, no facial rash, no headaches, no inflammation, no itching, no nausea, no numbness, no photophobia, no redness, no scotomas, no swelling, no tearing, no tingling, no vomiting and no weakness       Home Medications Prior to Admission medications   Medication Sig Start Date End Date Taking? Authorizing Provider  trimethoprim-polymyxin b (POLYTRIM) ophthalmic solution Place 1 drop into the left eye every 6 (six) hours for 5 days. 02/09/22 02/14/22 Yes Ladana Chavero, DO  EMGALITY, 300 MG DOSE, 100 MG/ML SOSY  02/13/19   [provider]  PRESCRIPTION MEDICATION Oxygen (clear air for cluster headaches)    [provider]  sulfamethoxazole-trimethoprim (BACTRIM DS) 800-160 MG tablet Take 1 tablet by mouth 2 (two) times daily. 07/22/21   Hoyt Koch, MD  SUMAtriptan Dellis Filbert) 20 MG/ACT nasal spray sumatriptan 20 mg/actuation nasal spray    [provider]  tadalafil (CIALIS) 5 MG tablet Take 1 tablet (5 mg total) by mouth daily. 07/22/21   Hoyt Koch, MD  valACYclovir (VALTREX) 1000 MG tablet Take 1 tablet (1,000 mg total) by mouth daily. 07/22/21   Hoyt Koch, MD      Allergies    Verapamil    Review of Systems   Review of Systems  Eyes:  Negative for blurred vision, double  vision, photophobia, discharge, redness and itching.  Gastrointestinal:  Negative for nausea and vomiting.  Neurological:  Negative for tingling, weakness, numbness and headaches.   Physical Exam Updated Vital Signs There were no vitals taken for this visit. Physical Exam Constitutional:      General: He is not in acute distress.    Appearance: He is not ill-appearing.  HENT:     Head: Normocephalic.     Nose: Nose normal.     Mouth/Throat:     Mouth: Mucous membranes are moist.  Eyes:     Extraocular Movements: Extraocular movements intact.     Conjunctiva/sclera: Conjunctivae normal.     Pupils: Pupils are equal, round, and reactive to light.     Comments: Swelling to the left upper eyelid, no periorbital swelling, no pain with extraocular movement, conjunctiva is normal, no visual field deficit  Neurological:     Mental Status: He is alert.    ED Results / Procedures / Treatments   Labs (all labs ordered are listed, but only abnormal results are displayed) Labs Reviewed - No data to display  EKG None  Radiology No results found.  Procedures Procedures    Medications Ordered in ED Medications - No data to display  ED Course/ Medical Decision Making/ A&P                           Medical Decision Making Risk Prescription drug management.  Paul Prince is here with swelling to his left upper eyelid.  It appears consistent with a stye.  We will place him on antibiotic drops.  Recommend warm compresses.  We will have him follow-up with ophthalmology if needed.  Normal extraocular movements, normal visual acuity and visual fields, conjunctiva is normal.  I have no concern for periorbital/preseptal/postseptal cellulitis.  Understands return precautions.  Discharged good condition.  This chart was dictated using voice recognition software.  Despite best efforts to proofread,  errors can occur which can change the documentation meaning.         Final  Clinical Impression(s) / ED Diagnoses Final diagnoses:  Hordeolum externum of left upper eyelid    Rx / DC Orders ED Discharge Orders          Ordered    trimethoprim-polymyxin b (POLYTRIM) ophthalmic solution  Every 6 hours        02/09/22 0720              Lennice Sites, DO 02/09/22 [phone]

## 2022-02-09 NOTE — ED Triage Notes (Signed)
Pt coming from home with c/o of swelling in upper eyelid of left eye since Friday. Pt reports that swelling has gotten worse and has been using warm and cold compresses. Hx styes.

## 2022-03-12 ENCOUNTER — Emergency Department (HOSPITAL_COMMUNITY)
Admission: EM | Admit: 2022-03-12 | Discharge: 2022-03-12 | Disposition: A | Discharge status: Home / Self Care | Payer: 59 | Attending: Emergency Medicine | Admitting: Emergency Medicine

## 2022-03-12 ENCOUNTER — Emergency Department (HOSPITAL_COMMUNITY): Payer: 59

## 2022-03-12 ENCOUNTER — Encounter (HOSPITAL_COMMUNITY): Payer: Self-pay | Admitting: Emergency Medicine

## 2022-03-12 DIAGNOSIS — Y990 Civilian activity done for income or pay: Secondary | ICD-10-CM | POA: Insufficient documentation

## 2022-03-12 DIAGNOSIS — W278XXA Contact with other nonpowered hand tool, initial encounter: Secondary | ICD-10-CM | POA: Insufficient documentation

## 2022-03-12 DIAGNOSIS — S6992XA Unspecified injury of left wrist, hand and finger(s), initial encounter: Secondary | ICD-10-CM | POA: Diagnosis present

## 2022-03-12 DIAGNOSIS — S60222A Contusion of left hand, initial encounter: Secondary | ICD-10-CM | POA: Insufficient documentation

## 2022-03-12 MED ORDER — IBUPROFEN 600 MG PO TABS: 600.0000 mg | ORAL_TABLET | Freq: Four times a day (QID) | ORAL | 0 refills | Status: DC | PRN

## 2022-03-12 MED ORDER — NAPROXEN 500 MG PO TABS: 500.0000 mg | ORAL_TABLET | Freq: Once | ORAL | Status: DC

## 2022-03-12 NOTE — ED Provider Notes (Signed)
?[facility]-EMERGENCY DEPT ?Provider Note ? ? ?CSN: [id number] ?Arrival date & time: 03/12/22  9518 ? ?  ? ?History ? ?Chief Complaint  ?Patient presents with  ? Hand Pain  ? ? ?Paul Prince is a 46 y.o. male. ? ?HPI ? ?  ? ?46 year old male comes in with chief complaint of hand pain ? ?Patient indicates that while at work, he accidentally hit his hand with a sledgehammer yesterday.  He has been having pain over his right hand since.  He took some NSAIDs, but the pain is persisted therefore he decided to come to the ER.  Pain is primarily located over the dorsum of his hand, over the base of his digits.  He is unable to fully flex his MCP joint without discomfort. No associated numbness.  ? ?Home Medications ?Prior to Admission medications   ?Medication Sig Start Date End Date Taking? Authorizing Provider  ?ibuprofen (ADVIL) 600 MG tablet Take 1 tablet (600 mg total) by mouth every 6 (six) hours as needed. 03/12/22  Yes Derwood Kaplan, MD  ?Ronnell [place], 300 MG DOSE, 100 MG/ML SOSY  02/13/19   [provider]  ?PRESCRIPTION MEDICATION Oxygen (clear air for cluster headaches)    [provider]  ?sulfamethoxazole-trimethoprim (BACTRIM DS) 800-160 MG tablet Take 1 tablet by mouth 2 (two) times daily. 07/22/21   Myrlene Broker, MD  ?SUMAtriptan Willette Brace) 20 MG/ACT nasal spray sumatriptan 20 mg/actuation nasal spray    [provider]  ?tadalafil (CIALIS) 5 MG tablet Take 1 tablet (5 mg total) by mouth daily. 07/22/21   Myrlene Broker, MD  ?valACYclovir (VALTREX) 1000 MG tablet Take 1 tablet (1,000 mg total) by mouth daily. 07/22/21   Myrlene Broker, MD  ?   ? ?Allergies    ?Verapamil   ? ?Review of Systems   ?Review of Systems  ?All other systems reviewed and are negative. ? ?Physical Exam ?Updated Vital Signs ?BP (!) 146/97   Pulse 67   Temp 97.9 ?F (36.6 ?C) (Oral)   Resp 18   SpO2 97%  ?Physical Exam ?Vitals and nursing note reviewed.  ?Constitutional:    ?   Appearance: He is well-developed.  ?HENT:  ?   Head: Atraumatic.  ?Cardiovascular:  ?   Rate and Rhythm: Normal rate.  ?Pulmonary:  ?   Effort: Pulmonary effort is normal.  ?Musculoskeletal:     ?   General: Tenderness present. No swelling.  ?   Cervical back: Neck supple.  ?   Comments: Patient has no significant edema over the dorsum of the left hand, but he has tenderness to palpation specifically over the MCP joint and proximal to it.  Patient able to give okay sign.  No numbness or tingling, cap refill is less than 3 seconds  ?Skin: ?   General: Skin is warm.  ?Neurological:  ?   Mental Status: He is alert and oriented to person, place, and time.  ? ? ?ED Results / Procedures / Treatments   ?Labs ?(all labs ordered are listed, but only abnormal results are displayed) ?Labs Reviewed - No data to display ? ?EKG ?None ? ?Radiology ?DG Hand Complete Left ? ?Result Date: 03/12/2022 ?CLINICAL DATA:  Injury EXAM: LEFT HAND - COMPLETE 3+ VIEW COMPARISON:  None. FINDINGS: There is no evidence of acute fracture. Alignment is normal. There is no significant arthritis. IMPRESSION: No acute osseous abnormality. Electronically Signed   By: Caprice Renshaw M.D.   On: 03/12/2022 10:09   ? ?  Procedures ?Procedures  ? ? ?Medications Ordered in ED ?Medications  ?naproxen (NAPROSYN) tablet 500 mg (500 mg Oral Not Given 03/12/22 1113)  ? ? ?ED Course/ Medical Decision Making/ A&P ?  ?                        ?Medical Decision Making ?Amount and/or Complexity of Data Reviewed ?Radiology: ordered. ? ?Risk ?Prescription drug management. ? ? ?This patient presents to the ED with chief complaint(s) of hand pain after a traumatic event yesterday. ?He is neurovascularly intact. ? ?The differential diagnosis includes : Hand contusion, hand fracture, soft tissue injury/tear ? ?The initial plan is to order x-ray of the hand and reassess ? ?Reassessment: ? ?Independent visualization of imaging: ?- Imaging that was independently visualized with  scope of interpretation limited to determining acute life threatening conditions related to emergency care: X-ray of the hand, which revealed no evidence of fracture of the hand itself ? ?Treatment and Reassessment: We will treat with RICE therapy and also put patient in a wrist brace. ? ?Follow-up with hand surgery only if not getting better with RICE therapy. ? ? ?Final Clinical Impression(s) / ED Diagnoses ?Final diagnoses:  ?Contusion of left hand, initial encounter  ? ? ?Rx / DC Orders ?ED Discharge Orders   ? ?      Ordered  ?  ibuprofen (ADVIL) 600 MG tablet  Every 6 hours PRN       ? 03/12/22 1054  ? ?  ?  ? ?  ? ? ?  ?Derwood Kaplan, MD ?03/12/22 1513 ? ?

## 2022-03-12 NOTE — Discharge Instructions (Addendum)
You were seen in the ER for your crush injury to your hand. ? ?Fortunately, the x-ray is not revealing any fractures. ? ?As discussed, the swelling might be the cause for the pain and range of motion difficulties.  We recommend that you apply ice for 10 to 15 minutes 6-8 times a day for the next couple of days and take the anti-inflammatory medication prescribed. ? ?Follow-up with a hand surgeon only if your symptoms are not getting better over the course of next 3 to 5 days. ? ?Use the wrist brace for further stability and support. ?

## 2022-03-12 NOTE — ED Triage Notes (Signed)
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

## 2022-04-16 ENCOUNTER — Ambulatory Visit: Payer: 59 | Admitting: Family Medicine

## 2022-04-16 ENCOUNTER — Encounter: Payer: Self-pay | Admitting: Family Medicine

## 2022-04-16 VITALS — BP 142/92 | HR 79 | Temp 98.2°F | Wt 201.0 lb

## 2022-04-16 DIAGNOSIS — M5432 Sciatica, left side: Secondary | ICD-10-CM

## 2022-04-16 MED ORDER — IBUPROFEN 600 MG PO TABS: 600.0000 mg | ORAL_TABLET | Freq: Four times a day (QID) | ORAL | 0 refills | Status: DC | PRN

## 2022-04-16 MED ORDER — METHOCARBAMOL 500 MG PO TABS: 500.0000 mg | ORAL_TABLET | Freq: Every evening | ORAL | 0 refills | Status: DC | PRN

## 2022-04-16 MED ORDER — KETOROLAC TROMETHAMINE 60 MG/2ML IM SOLN
60.0000 mg | Freq: Once | INTRAMUSCULAR | Status: AC
  Administered 2022-04-16: 60 mg via INTRAMUSCULAR

## 2022-04-16 NOTE — Patient Instructions (Signed)
Sciatica Rehab Ask your health care provider which exercises are safe for you. Do exercises exactly as told by your health care provider and adjust them as directed. It is normal to feel mild stretching, pulling, tightness, or discomfort as you do these exercises. Stop right away if you feel sudden pain or your pain gets worse. Do not begin these exercises until told by your health care provider. Stretching and range-of-motion exercises These exercises warm up your muscles and joints and improve the movement and flexibility of your hips and back. These exercises also help to relieve pain, numbness, and tingling. Sciatic nerve glide Sit in a chair with your head facing down toward your chest. Place your hands behind your back. Let your shoulders slump forward. Slowly straighten one of your legs while you tilt your head back as if you are looking toward the ceiling. Only straighten your leg as far as you can without making your symptoms worse. Hold this position for __________ seconds. Slowly return to the starting position. Repeat with your other leg. Repeat __________ times. Complete this exercise __________ times a day. Knee to chest with hip adduction and internal rotation  Lie on your back on a firm surface with both legs straight. Bend one of your knees and move it up toward your chest until you feel a gentle stretch in your lower back and buttock. Then, move your knee toward the shoulder that is on the opposite side from your leg. This is hip adduction and internal rotation. Hold your leg in this position by holding on to the front of your knee. Hold this position for __________ seconds. Slowly return to the starting position. Repeat with your other leg. Repeat __________ times. Complete this exercise __________ times a day. Prone extension on elbows  Lie on your abdomen on a firm surface. A bed may be too soft for this exercise. Prop yourself up on your elbows. Use your arms to help  lift your chest up until you feel a gentle stretch in your abdomen and your lower back. This will place some of your body weight on your elbows. If this is uncomfortable, try stacking pillows under your chest. Your hips should stay down, against the surface that you are lying on. Keep your hip and back muscles relaxed. Hold this position for __________ seconds. Slowly relax your upper body and return to the starting position. Repeat __________ times. Complete this exercise __________ times a day. Strengthening exercises These exercises build strength and endurance in your back. Endurance is the ability to use your muscles for a long time, even after they get tired. Pelvic tilt This exercise strengthens the muscles that lie deep in the abdomen. Lie on your back on a firm surface. Bend your knees and keep your feet flat on the floor. Tense your abdominal muscles. Tip your pelvis up toward the ceiling and flatten your lower back into the floor. To help with this exercise, you may place a small towel under your lower back and try to push your back into the towel. Hold this position for __________ seconds. Let your muscles relax completely before you repeat this exercise. Repeat __________ times. Complete this exercise __________ times a day. Alternating arm and leg raises  Get on your hands and knees on a firm surface. If you are on a hard floor, you may want to use padding, such as an exercise mat, to cushion your knees. Line up your arms and legs. Your hands should be directly below your shoulders,   and your knees should be directly below your hips. ?Lift your left leg behind you. At the same time, raise your right arm and straighten it in front of you. ?Do not lift your leg higher than your hip. ?Do not lift your arm higher than your shoulder. ?Keep your abdominal and back muscles tight. ?Keep your hips facing the ground. ?Do not arch your back. ?Keep your balance carefully, and do not hold your  breath. ?Hold this position for __________ seconds. ?Slowly return to the starting position. ?Repeat with your right leg and your left arm. ?Repeat __________ times. Complete this exercise __________ times a day. ?Posture and body mechanics ?Good posture and healthy body mechanics can help to relieve stress in your body's tissues and joints. Body mechanics refers to the movements and positions of your body while you do your daily activities. Posture is part of body mechanics. Good posture means: ?Your spine is in its natural S-curve position (neutral). ?Your shoulders are pulled back slightly. ?Your head is not tipped forward. ?Follow these guidelines to improve your posture and body mechanics in your everyday activities. ?Standing ? ?When standing, keep your spine neutral and your feet about hip width apart. Keep a slight bend in your knees. Your ears, shoulders, and hips should line up. ?When you do a task in which you stand in one place for a long time, place one foot up on a stable object that is 2-4 inches (5-10 cm) high, such as a footstool. This helps keep your spine neutral. ?Sitting ? ?When sitting, keep your spine neutral and keep your feet flat on the floor. Use a footrest, if necessary, and keep your thighs parallel to the floor. Avoid rounding your shoulders, and avoid tilting your head forward. ?When working at a desk or a computer, keep your desk at a height where your hands are slightly lower than your elbows. Slide your chair under your desk so you are close enough to maintain good posture. ?When working at a computer, place your monitor at a height where you are looking straight ahead and you do not have to tilt your head forward or downward to look at the screen. ?Resting ?When lying down and resting, avoid positions that are most painful for you. ?If you have pain with activities such as sitting, bending, stooping, or squatting, lie in a position in which your body does not bend very much. For  example, avoid curling up on your side with your arms and knees near your chest (fetal position). ?If you have pain with activities such as standing for a long time or reaching with your arms, lie with your spine in a neutral position and bend your knees slightly. Try the following positions: ?Lying on your side with a pillow between your knees. ?Lying on your back with a pillow under your knees. ?Lifting ? ?When lifting objects, keep your feet at least shoulder width apart and tighten your abdominal muscles. ?Bend your knees and hips and keep your spine neutral. It is important to lift using the strength of your legs, not your back. Do not lock your knees straight out. ?Always ask for help to lift heavy or awkward objects. ?This information is not intended to replace advice given to you by your health care provider. Make sure you discuss any questions you have with your health care provider. ?Document Revised: 03/24/2019 Document Reviewed: 12/22/2018 ?Elsevier Patient Education ? 2023 [employer] ? ? ?Sciatica ? ?Sciatica is pain, numbness, weakness, or tingling along the  path of the sciatic nerve. The sciatic nerve starts in the lower back and runs down the back of each leg. The nerve controls the muscles in the lower leg and in the back of the knee. It also provides feeling (sensation) to the back of the thigh, the lower leg, and the sole of the foot. Sciatica is a symptom of another medical condition that pinches or puts pressure on the sciatic nerve. ?Sciatica most often only affects one side of the body. Sciatica usually goes away on its own or with treatment. In some cases, sciatica may come back (recur). ?What are the causes? ?This condition is caused by pressure on the sciatic nerve or pinching of the nerve. This may be the result of: ?A disk in between the bones of the spine bulging out too far (herniated disk). ?Age-related changes in the spinal disks. ?A pain disorder that affects a muscle in the  buttock. ?Extra bone growth near the sciatic nerve. ?A break (fracture) of the pelvis. ?Pregnancy. ?Tumor. This is rare. ?What increases the risk? ?The following factors may make you more likely to develop this co

## 2022-04-16 NOTE — Progress Notes (Signed)
? ?Subjective:  ? ? ? Patient ID: Paul Prince, male    DOB: 1976/03/07, 46 y.o.   MRN: [id number] ? ?Chief Complaint  ?Patient presents with  ? Sciatica  ?  5 days now sciatic nerve has been radiating pain from left glute down left leg.   ? ? ?HPI ?Patient is in today for left sided buttock pain that is stabbing and radiates down his posterior left leg to the knee.  States pain is worse when he steps to push off.  Having trouble sleeping due to the pain. ?Reports history of sciatica and the same type of pain.  Denies recent injury. ? ?Reports using ice/heat at home and taking Tylenol. ? ?He drives a truck for work.  States he is supposed to be doing heavy lifting over the weekend and he does not think he will be able to do it due to the pain.  Requests a note for light duty. ? ?Denies fever, chills, abdominal pain, nausea, vomiting or diarrhea.  No saddle anesthesia.  No loss of control of bowels or bladder. ?Denies weakness. ? ? ?Health Maintenance Due  ?Topic Date Due  ? HIV Screening  Never done  ? Hepatitis C Screening  Never done  ? ? ?Past Medical History:  ?Diagnosis Date  ? Chronic headache   ? ? ?Past Surgical History:  ?Procedure Laterality Date  ? EXAMINATION UNDER ANESTHESIA N/A 08/11/2014  ? Procedure: EXAM UNDER ANESTHESIA;  Surgeon: Liz Malady, MD;  Location: [facility] OR;  Service: General;  Laterality: N/A;  ? INCISION AND DRAINAGE PERIRECTAL ABSCESS  08/11/2014  ? Procedure: IRRIGATION AND DEBRIDEMENT PERIRECTAL ABSCESS;  Surgeon: Liz Malady, MD;  Location: [facility] OR;  Service: General;;  ? ? ?Family History  ?Problem Relation Age of Onset  ? Bipolar disorder Mother   ? Heart attack Father   ? ? ?Social History  ? ?Socioeconomic History  ? Marital status: Legally Separated  ?  Spouse name: Not on file  ? Number of children: 6  ? Years of education: 61  ? Highest education level: Not on file  ?Occupational History  ?  Comment: truck driver, Cytogeneticist  ?Tobacco Use  ? Smoking status:  Every Day  ?  Packs/day: 1.00  ?  Years: 20.00  ?  Pack years: 20.00  ?  Types: Cigars, Cigarettes  ? Smokeless tobacco: Never  ? Tobacco comments:  ?  01/24/16 black and mild cigars (3-4 daily)  ?Vaping Use  ? Vaping Use: Never used  ?Substance and Sexual Activity  ? Alcohol use: Yes  ?  Alcohol/week: 0.0 standard drinks  ?  Comment: weekends  ? Drug use: No  ? Sexual activity: Not on file  ?Other Topics Concern  ? Not on file  ?Social History Narrative  ? Lives with SO and 2 14 yr girls.  Is a truck driver.   ? Caffeine use- none  ? ?Social Determinants of Health  ? ?Financial Resource Strain: Not on file  ?Food Insecurity: Not on file  ?Transportation Needs: Not on file  ?Physical Activity: Not on file  ?Stress: Not on file  ?Social Connections: Not on file  ?Intimate Partner Violence: Not on file  ? ? ?Outpatient Medications Prior to Visit  ?Medication Sig Dispense Refill  ? EMGALITY, 300 MG DOSE, 100 MG/ML SOSY     ? PRESCRIPTION MEDICATION Oxygen (clear air for cluster headaches)    ? SUMAtriptan (IMITREX) 20 MG/ACT nasal spray sumatriptan 20 mg/actuation nasal spray    ?  tadalafil (CIALIS) 5 MG tablet Take 1 tablet (5 mg total) by mouth daily. 30 tablet 11  ? valACYclovir (VALTREX) 1000 MG tablet Take 1 tablet (1,000 mg total) by mouth daily. 90 tablet 3  ? ibuprofen (ADVIL) 600 MG tablet Take 1 tablet (600 mg total) by mouth every 6 (six) hours as needed. 20 tablet 0  ? sulfamethoxazole-trimethoprim (BACTRIM DS) 800-160 MG tablet Take 1 tablet by mouth 2 (two) times daily. (Patient not taking: Reported on 04/16/2022) 14 tablet 0  ? ?No facility-administered medications prior to visit.  ? ? ?Allergies  ?Allergen Reactions  ? Verapamil   ?  Stated causing bruising  ? ? ?ROS ?Pertinent positives and negatives in the history of present illness. ? ?   ?Objective:  ?  ?Physical Exam ?Constitutional:   ?   General: He is not in acute distress. ?   Appearance: He is not ill-appearing or diaphoretic.  ?Cardiovascular:   ?   Rate and Rhythm: Normal rate.  ?   Pulses: Normal pulses.  ?Pulmonary:  ?   Effort: Pulmonary effort is normal.  ?Musculoskeletal:  ?   Cervical back: Normal range of motion and neck supple.  ?   Thoracic back: Normal.  ?   Lumbar back: Tenderness present. Decreased range of motion.  ?   Comments: Tenderness over left sciatic notch and piriformis muscle.  ?Pain with bending, extension or rotation of the back. No leg weakness. Bilateral LE are neurovascularly intact.   ?Skin: ?   General: Skin is warm and dry.  ?Neurological:  ?   Mental Status: He is alert and oriented to person, place, and time.  ?   Sensory: No sensory deficit.  ?   Motor: No weakness.  ?   Gait: Gait abnormal.  ? ? ?BP (!) 142/92 (BP Location: Left Arm, Patient Position: Sitting, Cuff Size: Large)   Pulse 79   Temp 98.2 ?F (36.8 ?C) (Temporal)   Wt 201 lb (91.2 kg)   SpO2 98%   BMI 27.26 kg/m?  ?Wt Readings from Last 3 Encounters:  ?04/16/22 201 lb (91.2 kg)  ?02/09/22 205 lb (93 kg)  ?07/22/21 203 lb 12.8 oz (92.4 kg)  ? ? ?   ?Assessment & Plan:  ? ?Problem List Items Addressed This Visit   ?None ?Visit Diagnoses   ? ? Sciatica of left side    -  Primary  ? Relevant Medications  ? ibuprofen (ADVIL) 600 MG tablet  ? methocarbamol (ROBAXIN) 500 MG tablet  ? ketorolac (TORADOL) injection 60 mg (Completed) (Start on 04/16/2022  4:45 PM)  ? Other Relevant Orders  ? Ambulatory referral to Sports Medicine  ? ?  ? 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 ?Toradol 60 mg IM injection given in office.  This has worked in the past for him. ?He will hold off on oral NSAIDs this evening.  Recommend Tylenol and then he may start ibuprofen tomorrow as needed.  Recommend ice or heat to the area.  Robaxin prescribed if he is unable to sleep due to the pain or if spasm occurs.  He will avoid driving, alcohol or operating machinery with this  medication due to potential sedation. ?Provided handouts regarding sciatica and stretches for low back/sciatica pain.  Discussed referral to PT versus sports medicine. ?Referral to sports medicine for further evaluation and treatment. ?Provided work note stating no  strenuous activity or long driving until he sees sports medicine. ? ?I have discontinued Jarell Armand's sulfamethoxazole-trimethoprim. I am also having him start on methocarbamol. Additionally, I am having him maintain his PRESCRIPTION MEDICATION, Emgality (300 MG Dose), SUMAtriptan, tadalafil, valACYclovir, and ibuprofen. We administered ketorolac. ? ?Meds ordered this encounter  ?Medications  ? ibuprofen (ADVIL) 600 MG tablet  ?  Sig: Take 1 tablet (600 mg total) by mouth every 6 (six) hours as needed.  ?  Dispense:  20 tablet  ?  Refill:  0  ?  Order Specific Question:   Supervising Provider  ?  Answer:   Hillard Danker A [[id number]]  ? methocarbamol (ROBAXIN) 500 MG tablet  ?  Sig: Take 1 tablet (500 mg total) by mouth at bedtime as needed for muscle spasms.  ?  Dispense:  30 tablet  ?  Refill:  0  ?  Order Specific Question:   Supervising Provider  ?  Answer:   Hillard Danker A [[id number]]  ? ketorolac (TORADOL) injection 60 mg  ? ? ?

## 2022-04-22 ENCOUNTER — Ambulatory Visit (INDEPENDENT_AMBULATORY_CARE_PROVIDER_SITE_OTHER): Payer: 59 | Admitting: Family Medicine

## 2022-04-22 ENCOUNTER — Ambulatory Visit (INDEPENDENT_AMBULATORY_CARE_PROVIDER_SITE_OTHER): Payer: 59

## 2022-04-22 VITALS — BP 164/92 | HR 66 | Ht 72.0 in | Wt 204.2 lb

## 2022-04-22 DIAGNOSIS — M5442 Lumbago with sciatica, left side: Secondary | ICD-10-CM

## 2022-04-22 MED ORDER — GABAPENTIN 300 MG PO CAPS: 300.0000 mg | ORAL_CAPSULE | Freq: Three times a day (TID) | ORAL | 3 refills | Status: DC | PRN

## 2022-04-22 MED ORDER — PREDNISONE 50 MG PO TABS: 50.0000 mg | ORAL_TABLET | Freq: Every day | ORAL | 0 refills | Status: DC

## 2022-04-22 NOTE — Patient Instructions (Addendum)
Thank you for coming in today.  ? ?Please get an Xray today before you leave  ? ?Recheck back in 6 weeks ? ? ?

## 2022-04-22 NOTE — Progress Notes (Signed)
? ?  I, Philbert Riser, LAT, ATC acting as a scribe for Clementeen Graham, MD. ? ?Subjective:   ? ?CC: L-sided low back/buttock pain ? ?HPI: Pt is a 46 y/o male c/o L-sided low back/buttock pain ongoing since 4/30. Pt locates pain to the L buttock w/ radiating pain down the posterior aspect of the L leg to knee. 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 Pt saw PCP on 04/16/22 for this complaint and was given a Toradol IM injection and was prescribed methocarbamol. No LBP. ? ?Radiating pain: yes ?LE numbness/tingling: yes ?LE weakness: yes ?Aggravates: stepping to push off, trying to sleep, sitting for long periods ?Treatments tried: ice, heat, Tylenol, stretching ? ?Dx imaging: 11/30/17 CT pelvis ? 08/10/14 CT pelvis ? ?Pertinent review of Systems: No fevers or chills ? ?Relevant historical information: Hypertension history of cluster headaches. ? ? ?Objective:   ? ?Vitals:  ? 04/22/22 1554  ?BP: (!) 164/92  ?Pulse: 66  ?SpO2: 98%  ? ?General: Well Developed, well nourished, and in no acute distress.  ? ?MSK: L-spine: Nontender. ?His lumbar motion. ?Lower extremity strength is intact. ?Positive left-sided slump test and straight leg raise test. ? ?Lab and Radiology Results ? ?X-ray images L-spine obtained today personally and independently interpreted ?Mild neuroforaminal stenosis at L5-S1. No acute fracture.  ?Await formal radiology review ? ? ? ? ?Impression and Recommendations:   ? ?Assessment and Plan: ?46 y.o. male with l lumbosacral radiculopathy left-sided.  This is thought to be more sciatica type.  Plan to treat with prednisone and gabapentin.  Continue home exercise program previously taught by PCP clinic.  If not improved in the next 6 weeks or if worsening likely next step will be MRI for injection planning.  Recheck in 6 weeks. ? ?PDMP not reviewed this encounter. ?Orders Placed This Encounter  ?Procedures  ? DG Lumbar Spine 2-3 Views  ?  Standing Status:   Future  ?  Number of  Occurrences:   1  ?  Standing Expiration Date:   05/23/2022  ?  Order Specific Question:   Reason for Exam (SYMPTOM  OR DIAGNOSIS REQUIRED)  ?  Answer:   low back pain  ?  Order Specific Question:   Preferred imaging location?  ?  Answer:   [facility]  ? ?Meds ordered this encounter  ?Medications  ? predniSONE (DELTASONE) 50 MG tablet  ?  Sig: Take 1 tablet (50 mg total) by mouth daily.  ?  Dispense:  5 tablet  ?  Refill:  0  ? gabapentin (NEURONTIN) 300 MG capsule  ?  Sig: Take 1 capsule (300 mg total) by mouth 3 (three) times daily as needed.  ?  Dispense:  90 capsule  ?  Refill:  3  ? ? ?Discussed warning signs or symptoms. Please see discharge instructions. Patient expresses understanding. ? ? ?The above documentation has been reviewed and is accurate and complete Clementeen Graham, M.D. ? ?

## 2022-04-27 NOTE — Progress Notes (Signed)
Lumbar spine x-ray looks normal to radiology.

## 2022-06-02 NOTE — Progress Notes (Deleted)
    Paul Prince is a 46 y.o. male who presents to [facility] at [facility] today for f/u of L-sided low back/buttock pain w/ radiating pain into his L post LE since 04/12/22.   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 Pt saw PCP on 04/16/22 for this complaint and was given a Toradol IM injection and was prescribed methocarbamol.  He was last seen by Dr. Denyse Amass on 04/22/22 and was prescribed gabapentin and a short course of prednisone.  Today, pt reports   Pertinent review of systems: ***  Relevant historical information: ***   Exam:  There were no vitals taken for this visit. General: Well Developed, well nourished, and in no acute distress.   MSK: ***    Lab and Radiology Results No results found for this or any previous visit (from the past 72 hour(s)). No results found.     Assessment and Plan: 46 y.o. male with ***   PDMP not reviewed this encounter. No orders of the defined types were placed in this encounter.  No orders of the defined types were placed in this encounter.    Discussed warning signs or symptoms. Please see discharge instructions. Patient expresses understanding.   ***

## 2022-06-05 ENCOUNTER — Ambulatory Visit: Payer: 59 | Admitting: Family Medicine

## 2023-04-05 ENCOUNTER — Encounter (HOSPITAL_COMMUNITY): Payer: Self-pay

## 2023-04-05 ENCOUNTER — Emergency Department (HOSPITAL_COMMUNITY)
Admission: EM | Admit: 2023-04-05 | Discharge: 2023-04-05 | Disposition: A | Discharge status: Home / Self Care | Payer: 59 | Attending: Emergency Medicine | Admitting: Emergency Medicine

## 2023-04-05 ENCOUNTER — Other Ambulatory Visit: Payer: Self-pay

## 2023-04-05 DIAGNOSIS — M5441 Lumbago with sciatica, right side: Secondary | ICD-10-CM | POA: Insufficient documentation

## 2023-04-05 DIAGNOSIS — M545 Low back pain, unspecified: Secondary | ICD-10-CM | POA: Diagnosis present

## 2023-04-05 MED ORDER — DEXAMETHASONE SODIUM PHOSPHATE 10 MG/ML IJ SOLN
10.0000 mg | Freq: Once | INTRAMUSCULAR | Status: AC
  Administered 2023-04-05: 10 mg via INTRAMUSCULAR
  Filled 2023-04-05: qty 1

## 2023-04-05 MED ORDER — IBUPROFEN 600 MG PO TABS: 600.0000 mg | ORAL_TABLET | Freq: Three times a day (TID) | ORAL | 0 refills | Status: AC | PRN

## 2023-04-05 MED ORDER — CYCLOBENZAPRINE HCL 10 MG PO TABS: 10.0000 mg | ORAL_TABLET | Freq: Three times a day (TID) | ORAL | 0 refills | Status: AC | PRN

## 2023-04-05 MED ORDER — KETOROLAC TROMETHAMINE 15 MG/ML IJ SOLN
15.0000 mg | Freq: Once | INTRAMUSCULAR | Status: AC
  Administered 2023-04-05: 15 mg via INTRAMUSCULAR
  Filled 2023-04-05: qty 1

## 2023-04-05 MED ORDER — OXYCODONE-ACETAMINOPHEN 5-325 MG PO TABS
2.0000 | ORAL_TABLET | Freq: Once | ORAL | Status: AC
  Administered 2023-04-05: 2 via ORAL
  Filled 2023-04-05: qty 2

## 2023-04-05 NOTE — Discharge Instructions (Signed)
Thank you for letting us take care of you today.  We gave you medications in the ED to help with your pain. I am starting you on muscle relaxers and NSAIDs at home to help with suspected sciatica flare. I also provided some exercises you can complete at home as tolerated to help rehabilitate your back.  You may take over the counter Tylenol on top of these medications. You may take up to  every 6 hours as needed. Do not take more than  in 24 hours.   Please follow up with your PCP next week to discuss your visit today, possibility of physical therapy or other treatments for your recurrent sciatica. If you develop new or worsening symptoms such as fever, neck pain, abdominal pain, loss of bowel or bladder control, weakness in your legs, or other new, concerning symptoms, please return to nearest emergency department for re-evaluation.

## 2023-04-05 NOTE — ED Provider Notes (Signed)
[place] EMERGENCY DEPARTMENT AT [facility] Provider Note   CSN: [id number] Arrival date & time: 04/05/23  1308     History  Chief Complaint  Patient presents with   Back Pain    Paul Prince is a 47 y.o. male with past medical history migraine headaches who presents to the ED complaining of flareup of lower back pain.  Patient reports right-sided lower back pain that radiates to the buttocks and right leg.  He recently completed a trip to [place] and back and relates this to the onset of his pain over the weekend.  He has a history of recurrent sciatica that usually resolves with muscle relaxers and NSAIDs.  He denies any fall or direct trauma to the back.  He denies bowel or bladder dysfunction, urinary symptoms, abdominal pain, chest pain, shortness of breath, paresthesias, or other symptoms at this time.  States that symptoms feel similar to his previous sciatica.  No history of IV drug use or malignancy.      Home Medications Prior to Admission medications   Medication Sig Start Date End Date Taking? Authorizing Provider  cyclobenzaprine (FLEXERIL) 10 MG tablet Take 1 tablet (10 mg total) by mouth 3 (three) times daily as needed for up to 5 days for muscle spasms. 04/05/23 04/10/23 Yes Brandie Lopes L, PA-C  ibuprofen (ADVIL) 600 MG tablet Take 1 tablet (600 mg total) by mouth every 8 (eight) hours as needed for up to 5 days for mild pain or moderate pain. 04/05/23 04/10/23 Yes Alf Doyle L, PA-C  EMGALITY, 300 MG DOSE, 100 MG/ML SOSY  02/13/19   [provider]  gabapentin (NEURONTIN) 300 MG capsule Take 1 capsule (300 mg total) by mouth 3 (three) times daily as needed. 04/22/22   Rodolph Bong, MD  PRESCRIPTION MEDICATION Oxygen (clear air for cluster headaches)    [provider]  SUMAtriptan (IMITREX) 20 MG/ACT nasal spray sumatriptan 20 mg/actuation nasal spray    [provider]  tadalafil (CIALIS) 5 MG tablet Take 1 tablet (5 mg  total) by mouth daily. 07/22/21   Myrlene Broker, MD  valACYclovir (VALTREX) 1000 MG tablet Take 1 tablet (1,000 mg total) by mouth daily. 07/22/21   Myrlene Broker, MD      Allergies    Verapamil    Review of Systems   Review of Systems  All other systems reviewed and are negative.   Physical Exam Updated Vital Signs BP (!) 189/106   Pulse 86   Temp 98.2 F (36.8 C) (Oral)   Resp 18   Ht 6' (1.829 m)   Wt 93 kg   SpO2 100%   BMI 27.80 kg/m  Physical Exam Vitals and nursing note reviewed.  Constitutional:      General: He is not in acute distress.    Appearance: Normal appearance.  HENT:     Head: Normocephalic and atraumatic.     Mouth/Throat:     Mouth: Mucous membranes are moist.  Eyes:     Conjunctiva/sclera: Conjunctivae normal.  Cardiovascular:     Rate and Rhythm: Normal rate and regular rhythm.     Heart sounds: No murmur heard. Pulmonary:     Effort: Pulmonary effort is normal.     Breath sounds: Normal breath sounds.  Abdominal:     General: Abdomen is flat. There is no distension.     Palpations: Abdomen is soft.     Tenderness: There is no abdominal tenderness. There is no right  CVA tenderness, left CVA tenderness, guarding or rebound.  Musculoskeletal:        General: Normal range of motion.     Cervical back: Normal range of motion and neck supple. No rigidity.     Right lower leg: No edema.     Left lower leg: No edema.     Comments: No midline CTL spine tenderness, step-offs, or deformities, moderate tenderness over the right buttock, 5/5 strength to bilateral upper and lower extremities, gait intact  Skin:    General: Skin is warm and dry.     Capillary Refill: Capillary refill takes less than 2 seconds.  Neurological:     Mental Status: He is alert. Mental status is at baseline.     Cranial Nerves: Cranial nerves 2-12 are intact.     Sensory: Sensation is intact.     Motor: Motor function is intact.     Coordination: Coordination  is intact.     Gait: Gait is intact.  Psychiatric:        Behavior: Behavior normal.     ED Results / Procedures / Treatments   Labs (all labs ordered are listed, but only abnormal results are displayed) Labs Reviewed - No data to display  EKG None  Radiology No results found.  Procedures Procedures    Medications Ordered in ED Medications  oxyCODONE-acetaminophen (PERCOCET/ROXICET) 5-325 MG per tablet 2 tablet (has no administration in time range)  ketorolac (TORADOL) 15 MG/ML injection 15 mg (has no administration in time range)  dexamethasone (DECADRON) injection 10 mg (has no administration in time range)    ED Course/ Medical Decision Making/ A&P                             Medical Decision Making Risk Prescription drug management.   Medical Decision Making:   Dontavion Noxon is a 47 y.o. male who presented to the ED today with back pain detailed above.    Patient's presentation is complicated by their history of sciatica, recent travel.  Complete initial physical exam performed, notably the patient  was in no acute distress. No midline spinal tenderness, stepoffs, or deformities. Abdomen soft and nontender. Nontoxic appearing.    Reviewed and confirmed nursing documentation for past medical history, family history, social history.    Initial Assessment:   With the patient's presentation of back pain, the emergent differential diagnosis for back pain includes but is not limited to fracture, muscle strain, cauda equina, spinal stenosis, DDD, ankylosing spondylitis, acute ligamentous injury, disk herniation, spondylolisthesis, epidural compression syndrome, metastatic cancer, transverse myelitis, vertebral osteomyelitis, diskitis, kidney stone, pyelonephritis, AAA, Perforated ulcer, retrocecal appendicitis, pancreatitis, bowel obstruction, retroperitoneal hemorrhage or mass, meningitis.   Initial Plan:  Symptomatic management Objective evaluation as reviewed    Final Assessment and Plan:   Patient presents to ED c/o back pain. No red flag symptoms. No history of malignancy or IV drug use. No severe trauma to the back.  History of recurrent sciatica.  Reports that symptoms are similar.  No midline spinal tenderness.  Abdomen soft and nontender.  Patient neurologically intact.  Overall, reassuring exam.  He is hypertensive but otherwise vital signs are within normal limits.  Will treat patient's pain in the ED with medications as above and discharged home on muscle relaxers and NSAIDs.  Encourage close PCP follow-up and recommendation for likely physical therapy to help prevent recurrent sciatic flares.  Patient agreeable with this plan.  Provided home  exercises to help rehabilitate while pending follow-up.  Patient given strict ED return precautions, all questions answered, and stable for discharge.   Clinical Impression:  1. Acute right-sided low back pain with right-sided sciatica      Discharge           Final Clinical Impression(s) / ED Diagnoses Final diagnoses:  Acute right-sided low back pain with right-sided sciatica    Rx / DC Orders ED Discharge Orders          Ordered    cyclobenzaprine (FLEXERIL) 10 MG tablet  3 times daily PRN        04/05/23 1036    ibuprofen (ADVIL) 600 MG tablet  Every 8 hours PRN        04/05/23 1036              Tonette Lederer, PA-C 04/05/23 1043    Wynetta Fines, MD 04/05/23 [phone]

## 2023-04-05 NOTE — ED Triage Notes (Signed)
Patient thinks he has sciatica. Has right buttocks pain that moves down his right leg. Tried soaking in epsomsalt bath and icey hot without relief.

## 2023-04-09 ENCOUNTER — Telehealth: Payer: Self-pay

## 2023-04-09 NOTE — Transitions of Care (Post Inpatient/ED Visit) (Signed)
   04/09/2023  Name: Paul Prince MRN: [id number] DOB: [birth year]  Today's TOC FU Call Status: Today's TOC FU Call Status:: Successful TOC FU Call Competed TOC FU Call Complete Date: 04/09/23  Red on EMMI-ED Discharge Alert Date & Reason:04/06/23 "Scheduled follow-up appt? 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 Pain has been better. He took some muscle relaxer last night-taking at night and not during the day while he is working and driving. He took some Advil this morning.)  Items Reviewed: Did you receive and understand the discharge instructions provided?: Yes Medications obtained and verified?: No (Pt currently at work-driving-unable to review work) Any new allergies since your discharge?: No Dietary orders reviewed?: NA Do you have support at home?: No  Home Care and Equipment/Supplies: Were Home Health Services Ordered?: NA Any new equipment or medical supplies ordered?: NA  Functional Questionnaire: Do you need assistance with bathing/showering or dressing?: No Do you need assistance with meal preparation?: No Do you need assistance with eating?: No Do you have difficulty maintaining continence: No Do you need assistance with getting out of bed/getting out of a chair/moving?: No Do you have difficulty managing or taking your medications?: No  Follow up appointments reviewed: PCP Follow-up appointment confirmed?: No (Offered to assist pt with making PCP follow up appt-he will call at later time-as he is working/driving) Specialist Hospital Follow-up appointment confirmed?: NA Do you need transportation to your follow-up appointment?: No Do  you understand care options if your condition(s) worsen?: Yes-patient verbalized understanding  SDOH Interventions Today    Flowsheet Row Most Recent Value  SDOH Interventions   Food Insecurity Interventions Intervention Not Indicated        Interventions Today    Flowsheet Row Most Recent Value  General Interventions   General Interventions Discussed/Reviewed General Interventions Discussed, Doctor Visits  Doctor Visits Discussed/Reviewed Doctor Visits Discussed, PCP, Specialist  Pharmacy Interventions   Pharmacy Dicussed/Reviewed Pharmacy Topics Discussed, Medications and their functions       Alessandra Grout [facility] Care Management Care Management Community Coordinator Direct Phone: [phone] Toll Free: [phone] Fax: [phone]

## 2023-05-25 ENCOUNTER — Other Ambulatory Visit: Payer: Self-pay

## 2023-05-25 ENCOUNTER — Emergency Department (HOSPITAL_COMMUNITY)
Admission: EM | Admit: 2023-05-25 | Discharge: 2023-05-25 | Disposition: A | Discharge status: Home / Self Care | Payer: 59 | Attending: Emergency Medicine | Admitting: Emergency Medicine

## 2023-05-25 ENCOUNTER — Encounter (HOSPITAL_COMMUNITY): Payer: Self-pay

## 2023-05-25 DIAGNOSIS — M545 Low back pain, unspecified: Secondary | ICD-10-CM | POA: Insufficient documentation

## 2023-05-25 DIAGNOSIS — M5442 Lumbago with sciatica, left side: Secondary | ICD-10-CM

## 2023-05-25 MED ORDER — METHYLPREDNISOLONE 4 MG PO TBPK: ORAL_TABLET | ORAL | 0 refills | Status: DC

## 2023-05-25 MED ORDER — CYCLOBENZAPRINE HCL 10 MG PO TABS: 10.0000 mg | ORAL_TABLET | Freq: Two times a day (BID) | ORAL | 0 refills | Status: DC | PRN

## 2023-05-25 MED ORDER — KETOROLAC TROMETHAMINE 60 MG/2ML IM SOLN
60.0000 mg | Freq: Once | INTRAMUSCULAR | Status: AC
  Administered 2023-05-25: 60 mg via INTRAMUSCULAR
  Filled 2023-05-25: qty 2

## 2023-05-25 MED ORDER — LIDOCAINE 5 % EX PTCH: 1.0000 | MEDICATED_PATCH | CUTANEOUS | 0 refills | Status: DC

## 2023-05-25 NOTE — ED Provider Notes (Signed)
[place] EMERGENCY DEPARTMENT AT [facility] Provider Note   CSN: [id number] Arrival date & time: 05/25/23  1541     History  Chief Complaint  Patient presents with   Back Pain    Sonny Anthes is a 47 y.o. male.  Patient here with left lower back pain.  Radiates down his left leg.  Denies any numbness or weakness.  Feels like he pulled his back when doing some workout recently.  Denies any issues going to the bathroom.  Denies nausea vomiting diarrhea or abdominal pain.  Tylenol has not helped.  History of the same in the past.  Has done well with muscle relaxants in the past.  The history is provided by the patient.       Home Medications Prior to Admission medications   Medication Sig Start Date End Date Taking? Authorizing Provider  cyclobenzaprine (FLEXERIL) 10 MG tablet Take 1 tablet (10 mg total) by mouth 2 (two) times daily as needed for muscle spasms. 05/25/23  Yes Vicente Weidler, DO  methylPREDNISolone (MEDROL DOSEPAK) 4 MG TBPK tablet Follow package insert 05/25/23  Yes Niasha Devins, DO  EMGALITY, 300 MG DOSE, 100 MG/ML SOSY  02/13/19   [provider]  gabapentin (NEURONTIN) 300 MG capsule Take 1 capsule (300 mg total) by mouth 3 (three) times daily as needed. 04/22/22   Rodolph Bong, MD  PRESCRIPTION MEDICATION Oxygen (clear air for cluster headaches)    [provider]  SUMAtriptan (IMITREX) 20 MG/ACT nasal spray sumatriptan 20 mg/actuation nasal spray    [provider]  tadalafil (CIALIS) 5 MG tablet Take 1 tablet (5 mg total) by mouth daily. 07/22/21   Myrlene Broker, MD  valACYclovir (VALTREX) 1000 MG tablet Take 1 tablet (1,000 mg total) by mouth daily. 07/22/21   Myrlene Broker, MD      Allergies    Verapamil    Review of Systems   Review of Systems  Physical Exam Updated Vital Signs BP (!) 167/100 (BP Location: Left Arm)   Pulse 84   Temp 98.1 F (36.7 C) (Oral)   Resp 16   Ht 6' (1.829 m)   Wt  93 kg   SpO2 99%   BMI 27.81 kg/m  Physical Exam Vitals and nursing note reviewed.  Constitutional:      General: He is not in acute distress.    Appearance: He is well-developed. He is not ill-appearing.  HENT:     Head: Normocephalic and atraumatic.  Eyes:     Conjunctiva/sclera: Conjunctivae normal.  Cardiovascular:     Rate and Rhythm: Normal rate and regular rhythm.     Pulses: Normal pulses.     Heart sounds: No murmur heard. Pulmonary:     Effort: Pulmonary effort is normal. No respiratory distress.     Breath sounds: Normal breath sounds.  Abdominal:     Palpations: Abdomen is soft.     Tenderness: There is no abdominal tenderness.  Musculoskeletal:        General: Tenderness present. No swelling. Normal range of motion.     Cervical back: Normal range of motion and neck supple. No tenderness.     Comments: Tenderness to the paraspinal lumbar muscles on the left, tenderness in the left gluteal muscles  Skin:    General: Skin is warm and dry.     Capillary Refill: Capillary refill takes less than 2 seconds.  Neurological:     General: No focal deficit present.  Mental Status: He is alert.     Comments: 5+ out of 5 strength, normal sensation  Psychiatric:        Mood and Affect: Mood normal.     ED Results / Procedures / Treatments   Labs (all labs ordered are listed, but only abnormal results are displayed) Labs Reviewed - No data to display  EKG None  Radiology No results found.  Procedures Procedures    Medications Ordered in ED Medications  ketorolac (TORADOL) injection 60 mg (has no administration in time range)    ED Course/ Medical Decision Making/ A&P                             Medical Decision Making Risk Prescription drug management.   Yetzael Stetzel is here with left lower back pain.  No significant medical history.  Unremarkable vitals.  No fever.  Differential diagnosis likely muscle spasm/sciatic nerve irritation.  I have no  concern for cauda equina, fracture, infectious process.  He has normal neurological exam.  He is tender in the paraspinal lumbar muscles on the left and left gluteal muscles.  Strength and sensation are intact.  He had good pulses in his lower extremities.  Have no concern for vascular issue or DVT.  Patient given Toradol shot and will prescribe Medrol Dosepak and Flexeril and have him follow-up with primary care doctor.  Discharged in good condition.  Patient had no red flag symptoms.  This chart was dictated using voice recognition software.  Despite best efforts to proofread,  errors can occur which can change the documentation meaning.         Final Clinical Impression(s) / ED Diagnoses Final diagnoses:  None    Rx / DC Orders ED Discharge Orders          Ordered    methylPREDNISolone (MEDROL DOSEPAK) 4 MG TBPK tablet        05/25/23 1710    cyclobenzaprine (FLEXERIL) 10 MG tablet  2 times daily PRN        05/25/23 1710    lidocaine (LIDODERM) 5 %  Every 24 hours,   Status:  Discontinued        05/25/23 1710              Tiasha Helvie, DO 05/25/23 1714

## 2023-05-25 NOTE — ED Triage Notes (Signed)
Patient is here for evaluation of back pain that radiates down left leg. States he has sciatic nerve pain.

## 2023-05-25 NOTE — Discharge Instructions (Signed)
Recommend 1000 mg of Tylenol every 6 hours as needed for pain.  Take methylprednisolone pack as prescribed.  Take cyclobenzaprine as prescribed but as discussed this medication is sedating so might be useful just to take at nighttime or when you are not doing any dangerous activities including driving.  Follow-up with your primary care doctor.

## 2023-05-27 ENCOUNTER — Ambulatory Visit: Payer: 59 | Admitting: Internal Medicine

## 2023-05-27 VITALS — BP 138/80 | HR 78 | Temp 98.5°F | Ht 72.0 in | Wt 208.0 lb

## 2023-05-27 DIAGNOSIS — M545 Low back pain, unspecified: Secondary | ICD-10-CM | POA: Diagnosis not present

## 2023-05-27 MED ORDER — KETOROLAC TROMETHAMINE 30 MG/ML IJ SOLN
30.0000 mg | Freq: Once | INTRAMUSCULAR | Status: AC
Start: 2023-05-27 — End: 2023-05-27
  Administered 2023-05-27: 30 mg via INTRAMUSCULAR

## 2023-05-27 MED ORDER — METHOCARBAMOL 500 MG PO TABS: 500.0000 mg | ORAL_TABLET | Freq: Four times a day (QID) | ORAL | 0 refills | Status: DC

## 2023-05-27 MED ORDER — METHYLPREDNISOLONE ACETATE 40 MG/ML IJ SUSP
40.0000 mg | Freq: Once | INTRAMUSCULAR | Status: AC
Start: 2023-05-27 — End: 2023-05-27
  Administered 2023-05-27: 40 mg via INTRAMUSCULAR

## 2023-05-27 NOTE — Progress Notes (Signed)
   Subjective:   Patient ID: Paul Prince, male    DOB: March 29, 1976, 47 y.o.   MRN: [id number]  HPI The patient is a 47 YO man coming in for ER follow up low back pain. Started 4 days ago.   Review of Systems  Constitutional:  Positive for activity change. Negative for appetite change, fatigue, fever and unexpected weight change.  Respiratory: Negative.    Cardiovascular: Negative.   Musculoskeletal:  Positive for back pain and myalgias. Negative for arthralgias.  Skin: Negative.   Neurological:  Negative for syncope, weakness and numbness.    Objective:  Physical Exam Constitutional:      Appearance: Normal appearance.  HENT:     Head: Normocephalic.  Cardiovascular:     Rate and Rhythm: Normal rate and regular rhythm.  Pulmonary:     Effort: Pulmonary effort is normal.  Musculoskeletal:        General: Tenderness present. Normal range of motion.  Skin:    General: Skin is warm and dry.  Neurological:     General: No focal deficit present.     Mental Status: He is alert and oriented to person, place, and time.     Vitals:   05/27/23 0951  BP: 138/80  Pulse: 78  Temp: 98.5 F (36.9 C)  TempSrc: Oral  SpO2: 99%  Weight: 208 lb (94.3 kg)  Height: 6' (1.829 m)    Assessment & Plan:  Depo-medrol 40 mg IM and toradol 30 mg IM given at visit

## 2023-05-27 NOTE — Assessment & Plan Note (Signed)
Given depo-medrol 40 mg IM and toradol 30 mg IM and rx robaxin 500 mg QID prn. Counseled that this can take 2-3 weeks to resolve.

## 2023-05-27 NOTE — Patient Instructions (Addendum)
We have given you two shots today and have sent in robaxin to take up to 4 times a day for the pain.

## 2023-12-15 ENCOUNTER — Encounter (HOSPITAL_COMMUNITY): Payer: Self-pay

## 2023-12-15 ENCOUNTER — Other Ambulatory Visit: Payer: Self-pay

## 2023-12-15 ENCOUNTER — Emergency Department (HOSPITAL_COMMUNITY)
Admission: EM | Admit: 2023-12-15 | Discharge: 2023-12-15 | Disposition: A | Discharge status: Home / Self Care | Payer: 59 | Attending: Emergency Medicine | Admitting: Emergency Medicine

## 2023-12-15 DIAGNOSIS — L0231 Cutaneous abscess of buttock: Secondary | ICD-10-CM | POA: Diagnosis present

## 2023-12-15 LAB — COMPREHENSIVE METABOLIC PANEL
ALT: 25 U/L (ref 0–44)
AST: 19 U/L (ref 15–41)
Albumin: 4.2 g/dL (ref 3.5–5.0)
Alkaline Phosphatase: 55 U/L (ref 38–126)
Anion gap: 7 (ref 5–15)
BUN: 10 mg/dL (ref 6–20)
CO2: 25 mmol/L (ref 22–32)
Calcium: 9.2 mg/dL (ref 8.9–10.3)
Chloride: 106 mmol/L (ref 98–111)
Creatinine, Ser: 0.81 mg/dL (ref 0.61–1.24)
GFR, Estimated: >60 mL/min (ref >60)
Glucose, Bld: 84 mg/dL (ref 70–99)
Potassium: 3.6 mmol/L (ref 3.5–5.1)
Sodium: 138 mmol/L (ref 135–145)
Total Bilirubin: 0.6 mg/dL (ref 0.0–1.2)
Total Protein: 7.6 g/dL (ref 6.5–8.1)

## 2023-12-15 LAB — CBC WITH DIFFERENTIAL/PLATELET
Abs Immature Granulocytes: 0.02 10*3/uL (ref 0.00–0.07)
Basophils Absolute: 0 10*3/uL (ref 0.0–0.1)
Basophils Relative: 1 %
Eosinophils Absolute: 0.1 10*3/uL (ref 0.0–0.5)
Eosinophils Relative: 2 %
HCT: 40.9 % (ref 39.0–52.0)
Hemoglobin: 13.4 g/dL (ref 13.0–17.0)
Immature Granulocytes: 0 %
Lymphocytes Relative: 27 %
Lymphs Abs: 2.4 10*3/uL (ref 0.7–4.0)
MCH: 31.5 pg (ref 26.0–34.0)
MCHC: 32.8 g/dL (ref 30.0–36.0)
MCV: 96.2 fL (ref 80.0–100.0)
Monocytes Absolute: 1 10*3/uL (ref 0.1–1.0)
Monocytes Relative: 12 %
Neutro Abs: 5.1 10*3/uL (ref 1.7–7.7)
Neutrophils Relative %: 58 %
Platelets: 245 10*3/uL (ref 150–400)
RBC: 4.25 MIL/uL (ref 4.22–5.81)
RDW: 14.7 % (ref 11.5–15.5)
WBC: 8.7 10*3/uL (ref 4.0–10.5)
nRBC: 0 % (ref 0.0–0.2)

## 2023-12-15 MED ORDER — OXYCODONE-ACETAMINOPHEN 5-325 MG PO TABS
1.0000 | ORAL_TABLET | Freq: Once | ORAL | Status: AC
  Administered 2023-12-15: 1 via ORAL
  Filled 2023-12-15: qty 1

## 2023-12-15 MED ORDER — LIDOCAINE-EPINEPHRINE (PF) 2 %-1:200000 IJ SOLN
10.0000 mL | Freq: Once | INTRAMUSCULAR | Status: AC
  Administered 2023-12-15: 10 mL via INTRADERMAL
  Filled 2023-12-15: qty 20

## 2023-12-15 NOTE — ED Provider Notes (Signed)
 [place] EMERGENCY DEPARTMENT AT [facility] Provider Note   CSN: [id number] Arrival date & time: 12/15/23  1221     History Chief Complaint  Patient presents with   Abscess    Paul Prince is a 48 y.o. male.  Patient presents the emergency department concerns of an abscess.  Reports prior history of an abscess requiring surgical drainage.  Denies any recent fever chills or bodyaches.  No recent drainage from the site.  States that the abscesses in the right gluteal cleft.  Has not been on antibiotics for this either.   Abscess      Home Medications Prior to Admission medications   Medication Sig Start Date End Date Taking? Authorizing Provider  EMGALITY, 300 MG DOSE, 100 MG/ML SOSY  02/13/19   [provider]  gabapentin  (NEURONTIN ) 300 MG capsule Take 1 capsule (300 mg total) by mouth 3 (three) times daily as needed. 04/22/22   Corey, Evan S, MD  methocarbamol  (ROBAXIN ) 500 MG tablet Take 1 tablet (500 mg total) by mouth 4 (four) times daily. 05/27/23   Rollene Almarie LABOR, MD  methylPREDNISolone  (MEDROL  DOSEPAK) 4 MG TBPK tablet Follow package insert 05/25/23   Curatolo, Adam, DO  SUMAtriptan  (IMITREX ) 20 MG/ACT nasal spray sumatriptan  20 mg/actuation nasal spray    [provider]  valACYclovir  (VALTREX ) 1000 MG tablet Take 1 tablet (1,000 mg total) by mouth daily. 07/22/21   Rollene Almarie LABOR, MD      Allergies    Verapamil     Review of Systems   Review of Systems  Skin:  Positive for wound.  All other systems reviewed and are negative.   Physical Exam Updated Vital Signs BP (!) 144/85   Pulse 90   Temp 98.5 F (36.9 C) (Oral)   Resp 14   Ht 6' (1.829 m)   Wt 93 kg   SpO2 100%   BMI 27.80 kg/m  Physical Exam Vitals and nursing note reviewed.  Constitutional:      General: He is not in acute distress.    Appearance: He is well-developed.  HENT:     Head: Normocephalic and atraumatic.  Eyes:     Conjunctiva/sclera:  Conjunctivae normal.  Cardiovascular:     Rate and Rhythm: Normal rate and regular rhythm.     Heart sounds: No murmur heard. Pulmonary:     Effort: Pulmonary effort is normal. No respiratory distress.     Breath sounds: Normal breath sounds.  Abdominal:     Palpations: Abdomen is soft.     Tenderness: There is no abdominal tenderness.  Musculoskeletal:        General: No swelling.     Cervical back: Neck supple.  Skin:    General: Skin is warm and dry.     Capillary Refill: Capillary refill takes less than 2 seconds.     Findings: Lesion present.     Comments: Abscess to the right side gluteal cleft, no skin erythema and minimal induration  Neurological:     Mental Status: He is alert.  Psychiatric:        Mood and Affect: Mood normal.     ED Results / Procedures / Treatments   Labs (all labs ordered are listed, but only abnormal results are displayed) Labs Reviewed  CBC WITH DIFFERENTIAL/PLATELET  COMPREHENSIVE METABOLIC PANEL    EKG None  Radiology No results found.  Procedures .Ultrasound ED Soft Tissue  Date/Time: 12/15/2023 7:49 PM  Performed by: Dannette Kinkaid A, PA-C Authorized  by: Jiaire Rosebrook A, PA-C   Procedure details:    Indications: localization of abscess     Transverse view:  Visualized   Longitudinal view:  Visualized   Images: archived     Limitations:  Positioning Location:    Location: buttocks     Side:  Right Findings:     abscess present .Incision and Drainage  Date/Time: 12/15/2023 7:50 PM  Performed by: Tuwana Kapaun A, PA-C Authorized by: Paisyn Guercio A, PA-C   Consent:    Consent obtained:  Verbal   Consent given by:  Patient   Risks discussed:  Bleeding, incomplete drainage, pain and infection Universal protocol:    Patient identity confirmed:  Verbally with patient Location:    Type:  Abscess   Size:  2cm   Location:  Anogenital   Anogenital location:  Gluteal cleft Pre-procedure details:    Skin preparation:   Povidone-iodine Sedation:    Sedation type:  None Anesthesia:    Anesthesia method:  Local infiltration   Local anesthetic:  Lidocaine  2% WITH epi Procedure type:    Complexity:  Simple Procedure details:    Ultrasound guidance: yes     Needle aspiration: yes     Needle size:  25 G   Incision types:  Stab incision   Incision depth:  Dermal   Wound management:  Probed and deloculated, irrigated with saline, extensive cleaning and debrided   Drainage:  Purulent   Drainage amount:  Moderate   Wound treatment:  Wound left open   Packing materials:  None Post-procedure details:    Procedure completion:  Tolerated    Medications Ordered in ED Medications  lidocaine -EPINEPHrine  (XYLOCAINE  W/EPI) 2 %-1:200000 (PF) injection 10 mL (10 mLs Intradermal Given by Other 12/15/23 1900)  oxyCODONE -acetaminophen  (PERCOCET/ROXICET) 5-325 MG per tablet 1 tablet (1 tablet Oral Given 12/15/23 1900)    ED Course/ Medical Decision Making/ A&P                                 Medical Decision Making Amount and/or Complexity of Data Reviewed Labs: ordered.  Risk Prescription drug management.   This patient presents to the ED for concern of abscess.  Differential diagnosis includes pilonidal cyst, gluteal abscess, deep space infection, cellulitis   Lab Tests:  I Ordered, and personally interpreted labs.  The pertinent results include: CBC unremarkable, CMP unremarkable   Medicines ordered and prescription drug management:  I ordered medication including Percocet, lidocaine  epinephrine  for pain, anesthesia Reevaluation of the patient after these medicines showed that the patient improved I have reviewed the patients home medicines and have made adjustments as needed   Problem List / ED Course:  Patient presents emergency department concerns of an abscess.  Prior history of gluteal abscess requiring surgical management.  Denies any recent fever chills or bodyaches. Some reservations about  having the area drained given prior complications of his abscess. Discussed possible antibiotic management although the area is clearly amenable to drainage at the right gluteal cleft. US  demonstrated presence of abscess. Patient consents to drainage. I&D performed successfully and patient tolerate procedure well. Discussed return precautions such as evidence of infection. No acute concerns for cellulitis at this time so I suspect this will heal well without further intervention. No other acute concerns at this time and patient discharged home in stable condition.  Final Clinical Impression(s) / ED Diagnoses Final diagnoses:  Abscess of right buttock  Rx / DC Orders ED Discharge Orders     None         Cecily Legrand DELENA DEVONNA 12/15/23 1956    Cottie Donnice PARAS, MD 12/15/23 2008

## 2023-12-15 NOTE — Discharge Instructions (Signed)
 You were seen in the ER today for concerns of an abscess. I was able to confirm the abscess with an ultrasound and drainage was performed which produced a moderate amount of pus from the area. I would recommend keeping the area clean over the next few days and avoid soaking the area in a hot bath. Sitz baths are appropriate. If any concerns for infection arise, please return to the ER or follow up with your primary care provider.

## 2023-12-15 NOTE — ED Triage Notes (Signed)
 Pt reports with a peri anal abscess x 2 days. Pt reports recently having surgery for same issue.

## 2024-02-01 ENCOUNTER — Ambulatory Visit: Payer: 59 | Admitting: Internal Medicine

## 2024-10-08 ENCOUNTER — Ambulatory Visit (HOSPITAL_COMMUNITY)
Admission: EM | Admit: 2024-10-08 | Discharge: 2024-10-08 | Disposition: A | Discharge status: Home / Self Care | Source: Home / Self Care

## 2024-10-08 ENCOUNTER — Encounter (HOSPITAL_COMMUNITY): Payer: Self-pay

## 2024-10-08 DIAGNOSIS — M7989 Other specified soft tissue disorders: Secondary | ICD-10-CM | POA: Insufficient documentation

## 2024-10-08 DIAGNOSIS — M6282 Rhabdomyolysis: Secondary | ICD-10-CM | POA: Diagnosis not present

## 2024-10-08 LAB — CBC WITH DIFFERENTIAL/PLATELET
Abs Immature Granulocytes: 0.02 K/uL (ref 0.00–0.07)
Basophils Absolute: 0 K/uL (ref 0.0–0.1)
Basophils Relative: 1 %
Eosinophils Absolute: 0.2 K/uL (ref 0.0–0.5)
Eosinophils Relative: 3 %
HCT: 39 % (ref 39.0–52.0)
Hemoglobin: 12.9 g/dL — ABNORMAL LOW (ref 13.0–17.0)
Immature Granulocytes: 0 %
Lymphocytes Relative: 28 %
Lymphs Abs: 2 K/uL (ref 0.7–4.0)
MCH: 31.2 pg (ref 26.0–34.0)
MCHC: 33.1 g/dL (ref 30.0–36.0)
MCV: 94.4 fL (ref 80.0–100.0)
Monocytes Absolute: 0.7 K/uL (ref 0.1–1.0)
Monocytes Relative: 10 %
Neutro Abs: 4.3 K/uL (ref 1.7–7.7)
Neutrophils Relative %: 58 %
Platelets: 278 K/uL (ref 150–400)
RBC: 4.13 MIL/uL — ABNORMAL LOW (ref 4.22–5.81)
RDW: 15 % (ref 11.5–15.5)
WBC: 7.4 K/uL (ref 4.0–10.5)
nRBC: 0 % (ref 0.0–0.2)

## 2024-10-08 LAB — CK: Total CK: 18112 U/L — ABNORMAL HIGH (ref 49–397)

## 2024-10-08 LAB — POCT URINALYSIS DIP (MANUAL ENTRY)
Glucose, UA: NEGATIVE mg/dL
Leukocytes, UA: NEGATIVE
Nitrite, UA: NEGATIVE
Protein Ur, POC: 100 mg/dL — AB
Spec Grav, UA: >=1.03 — AB (ref 1.010–1.025)
Urobilinogen, UA: 1 U/dL
pH, UA: 5.5 (ref 5.0–8.0)

## 2024-10-08 LAB — COMPREHENSIVE METABOLIC PANEL WITH GFR
ALT: 239 U/L — ABNORMAL HIGH (ref 0–44)
AST: 491 U/L — ABNORMAL HIGH (ref 15–41)
Albumin: 3.9 g/dL (ref 3.5–5.0)
Alkaline Phosphatase: 49 U/L (ref 38–126)
Anion gap: 8 (ref 5–15)
BUN: 10 mg/dL (ref 6–20)
CO2: 26 mmol/L (ref 22–32)
Calcium: 9.2 mg/dL (ref 8.9–10.3)
Chloride: 103 mmol/L (ref 98–111)
Creatinine, Ser: 0.85 mg/dL (ref 0.61–1.24)
GFR, Estimated: >60 mL/min (ref >60)
Glucose, Bld: 69 mg/dL — ABNORMAL LOW (ref 70–99)
Potassium: 3.7 mmol/L (ref 3.5–5.1)
Sodium: 137 mmol/L (ref 135–145)
Total Bilirubin: 0.6 mg/dL (ref 0.0–1.2)
Total Protein: 6.8 g/dL (ref 6.5–8.1)

## 2024-10-08 NOTE — ED Provider Notes (Signed)
 [facility]    CSN: [id number] Arrival date & time: 10/08/24  1205      History   Chief Complaint Chief Complaint  Patient presents with   Arm Swelling    HPI Paul Prince is a 48 y.o. male.   Patient with concerns for bilateral arm swelling that began on 10/22 after lifting weights.  Patient states that he does not normally lift weights and on 10/22 he was lifting 35 pound weights and states that he does feel like he may have pushed himself more than he normally would when he goes to lift weights.  Patient states he also does not drink a lot of water usually and is only drinking sodas or tea.  Patient states that he initially began to have some arm soreness which is pretty typical after lifting weights, but was concerned when he began to notice swelling to his upper arms that now seems to be spreading down to his forearms as well.  Patient states that he is not in a significant amount of pain but his arms feel tight.  Patient denies weakness, numbness, tingling, or dizziness.  Patient states that he believes his urine may appear slightly darker than it normally does, but he states that because he does not normally drink water he did not think anything of this.  The history is provided by the patient and medical records.    Past Medical History:  Diagnosis Date   Chronic headache     Patient Active Problem List   Diagnosis Date Noted   Inguinal abscess 07/25/2021   Painless hematuria 11/24/2019   Herpes 02/24/2019   Severe depression (HCC) 02/24/2019   HTN (hypertension) 06/09/2017   Low back pain 08/04/2016   Contact dermatitis 07/22/2016   Episodic cluster headache, not intractable 07/22/2015   Pilonidal cyst 06/13/2015   Routine general medical examination at a health care facility 10/23/2014   Tobacco abuse 10/23/2014   Cluster headache 10/23/2014    Past Surgical History:  Procedure Laterality Date   EXAMINATION UNDER ANESTHESIA N/A 08/11/2014    Procedure: EXAM UNDER ANESTHESIA;  Surgeon: Dann FORBES Hummer, MD;  Location: [facility] OR;  Service: General;  Laterality: N/A;   INCISION AND DRAINAGE PERIRECTAL ABSCESS  08/11/2014   Procedure: IRRIGATION AND DEBRIDEMENT PERIRECTAL ABSCESS;  Surgeon: Dann FORBES Hummer, MD;  Location: [facility] OR;  Service: General;;       Home Medications    Prior to Admission medications   Medication Sig Start Date End Date Taking? Authorizing Provider  SUMAtriptan  6 MG/0.5ML SOAJ 1 injection every 2 hours AS NEEDED.  LIMIT 2/DAY 04/10/24  Yes [provider]  EMGALITY, 300 MG DOSE, 100 MG/ML SOSY  02/13/19   [provider]    Family History Family History  Problem Relation Age of Onset   Bipolar disorder Mother    Heart attack Father     Social History Social History   Tobacco Use   Smoking status: Every Day    Current packs/day: 1.00    Average packs/day: 1 pack/day for 20.0 years (20.0 ttl pk-yrs)    Types: Cigars, Cigarettes   Smokeless tobacco: Never   Tobacco comments:    01/24/16 black and mild cigars (3-4 daily)  Vaping Use   Vaping status: Never Used  Substance Use Topics   Alcohol use: Yes    Alcohol/week: 0.0 standard drinks of alcohol    Comment: weekends   Drug use: No     Allergies   Verapamil   Review of Systems Review of Systems  Per HPI  Physical Exam Triage Vital Signs ED Triage Vitals  Encounter Vitals Group     BP 10/08/24 1326 (!) 140/78     Girls Systolic BP Percentile --      Girls Diastolic BP Percentile --      Boys Systolic BP Percentile --      Boys Diastolic BP Percentile --      Pulse Rate 10/08/24 1326 87     Resp 10/08/24 1326 16     Temp 10/08/24 1326 98 F (36.7 C)     Temp Source 10/08/24 1326 Oral     SpO2 10/08/24 1326 98 %     Weight --      Height --      Head Circumference --      Peak Flow --      Pain Score 10/08/24 1325 7     Pain Loc --      Pain Education --      Exclude from Growth Chart --    No data  found.  Updated Vital Signs BP (!) 140/78 (BP Location: Left Arm)   Pulse 87   Temp 98 F (36.7 C) (Oral)   Resp 16   SpO2 98%   Visual Acuity Right Eye Distance:   Left Eye Distance:   Bilateral Distance:    Right Eye Near:   Left Eye Near:    Bilateral Near:     Physical Exam Vitals and nursing note reviewed.  Constitutional:      General: He is awake. He is not in acute distress.    Appearance: Normal appearance. He is well-developed and well-groomed. He is not ill-appearing.  Cardiovascular:     Rate and Rhythm: Normal rate and regular rhythm.     Pulses:          Radial pulses are 2+ on the right side and 2+ on the left side.     Heart sounds: Normal heart sounds.  Musculoskeletal:     Right upper arm: Swelling and edema present. No deformity, tenderness or bony tenderness.     Left upper arm: Swelling and edema present. No deformity or bony tenderness.     Right forearm: Swelling present. No tenderness or bony tenderness.     Left forearm: Swelling present. No tenderness or bony tenderness.     Comments: 2+ nonpitting edema noted to bilateral upper arms.  1+ swelling noted to bilateral forearms.  Skin:    General: Skin is warm and dry.  Neurological:     Mental Status: He is alert.  Psychiatric:        Behavior: Behavior is cooperative.      UC Treatments / Results  Labs (all labs ordered are listed, but only abnormal results are displayed) Labs Reviewed  POCT URINALYSIS DIP (MANUAL ENTRY) - Abnormal; Notable for the following components:      Result Value   Color, UA other (*)    Clarity, UA cloudy (*)    Bilirubin, UA small (*)    Ketones, POC UA trace (5) (*)    Spec Grav, UA >=1.030 (*)    Blood, UA moderate (*)    Protein Ur, POC =100 (*)    All other components within normal limits  CBC WITH DIFFERENTIAL/PLATELET  COMPREHENSIVE METABOLIC PANEL WITH GFR  CK    EKG   Radiology No results found.  Procedures Procedures (including critical  care time)  Medications Ordered in  UC Medications - No data to display  Initial Impression / Assessment and Plan / UC Course  I have reviewed the triage vital signs and the nursing notes.  Pertinent labs & imaging results that were available during my care of the patient were reviewed by me and considered in my medical decision making (see chart for details).     Patient is overall well-appearing.  Vitals are stable.  Blood pressure is mildly elevated at 140/78.  2+ nonpitting edema noted to bilateral upper arms.  1+ swelling noted to bilateral forearms.  No other significant findings on exam.  Urinalysis reveals some signs of dehydration.  Ordered CBC, CMP, and CK to rule out underlying causes related to symptoms.  Concern for rhabdomyolysis due to recent weight lifting with new bilateral arm swelling.  Recommended patient increase water intake over the next few days.  Discussed follow-up, return, and strict ER precautions. Final Clinical Impressions(s) / UC Diagnoses   Final diagnoses:  Arm swelling     Discharge Instructions      As discussed your arm swelling could be a result of a condition called rhabdomyolysis.  As discussed this is a condition with the muscle cells breakdown and can ultimately damage your kidneys and cause severe dehydration. Your urinalysis today did reveal some signs of dehydration.  We have drawn some lab work to rule out rhabdomyolysis, severe dehydration, or acute kidney damage.  If any of these results are concerning someone will call and advise treatment at that time. For now increase your water intake over the next few days and get some rest. Follow-up with your primary care provider or return here as needed. If you develop severe weakness, dizziness, passing out, or confusion please seek immediate medical treatment in the emergency department.     ED Prescriptions   None    PDMP not reviewed this encounter.   Johnie Flaming A, NP 10/08/24  1425

## 2024-10-08 NOTE — Discharge Instructions (Signed)
 As discussed your arm swelling could be a result of a condition called rhabdomyolysis.  As discussed this is a condition with the muscle cells breakdown and can ultimately damage your kidneys and cause severe dehydration. Your urinalysis today did reveal some signs of dehydration.  We have drawn some lab work to rule out rhabdomyolysis, severe dehydration, or acute kidney damage.  If any of these results are concerning someone will call and advise treatment at that time. For now increase your water intake over the next few days and get some rest. Follow-up with your primary care provider or return here as needed. If you develop severe weakness, dizziness, passing out, or confusion please seek immediate medical treatment in the emergency department.

## 2024-10-08 NOTE — ED Triage Notes (Signed)
 Patient here today with c/o bilat arm swelling since Wednesday after lifting weights. Patient states that he was having soreness and that has gone but he is still having tightness and swelling.

## 2024-10-09 ENCOUNTER — Ambulatory Visit (HOSPITAL_COMMUNITY): Payer: Self-pay

## 2024-10-09 ENCOUNTER — Other Ambulatory Visit: Payer: Self-pay

## 2024-10-09 ENCOUNTER — Inpatient Hospital Stay (HOSPITAL_COMMUNITY)
Admission: EM | Admit: 2024-10-09 | Discharge: 2024-10-12 | DRG: 558 | Disposition: A | Discharge status: Home / Self Care | Attending: Internal Medicine | Admitting: Internal Medicine

## 2024-10-09 ENCOUNTER — Emergency Department (HOSPITAL_COMMUNITY)

## 2024-10-09 ENCOUNTER — Encounter (HOSPITAL_COMMUNITY): Payer: Self-pay

## 2024-10-09 DIAGNOSIS — M6282 Rhabdomyolysis: Secondary | ICD-10-CM | POA: Diagnosis present

## 2024-10-09 DIAGNOSIS — Z79899 Other long term (current) drug therapy: Secondary | ICD-10-CM

## 2024-10-09 DIAGNOSIS — T733XXA Exhaustion due to excessive exertion, initial encounter: Secondary | ICD-10-CM | POA: Diagnosis present

## 2024-10-09 DIAGNOSIS — G44009 Cluster headache syndrome, unspecified, not intractable: Secondary | ICD-10-CM | POA: Diagnosis present

## 2024-10-09 DIAGNOSIS — F32A Depression, unspecified: Secondary | ICD-10-CM | POA: Diagnosis present

## 2024-10-09 DIAGNOSIS — M5430 Sciatica, unspecified side: Secondary | ICD-10-CM | POA: Diagnosis present

## 2024-10-09 DIAGNOSIS — E860 Dehydration: Secondary | ICD-10-CM | POA: Diagnosis present

## 2024-10-09 DIAGNOSIS — X500XXA Overexertion from strenuous movement or load, initial encounter: Secondary | ICD-10-CM

## 2024-10-09 DIAGNOSIS — Z888 Allergy status to other drugs, medicaments and biological substances status: Secondary | ICD-10-CM | POA: Diagnosis not present

## 2024-10-09 DIAGNOSIS — Z8249 Family history of ischemic heart disease and other diseases of the circulatory system: Secondary | ICD-10-CM | POA: Diagnosis not present

## 2024-10-09 DIAGNOSIS — I10 Essential (primary) hypertension: Secondary | ICD-10-CM | POA: Diagnosis present

## 2024-10-09 DIAGNOSIS — M5410 Radiculopathy, site unspecified: Secondary | ICD-10-CM | POA: Diagnosis present

## 2024-10-09 DIAGNOSIS — F17210 Nicotine dependence, cigarettes, uncomplicated: Secondary | ICD-10-CM | POA: Diagnosis present

## 2024-10-09 DIAGNOSIS — Z818 Family history of other mental and behavioral disorders: Secondary | ICD-10-CM | POA: Diagnosis not present

## 2024-10-09 LAB — CBC WITH DIFFERENTIAL/PLATELET
Abs Immature Granulocytes: 0.02 K/uL (ref 0.00–0.07)
Basophils Absolute: 0 K/uL (ref 0.0–0.1)
Basophils Relative: 1 %
Eosinophils Absolute: 0.2 K/uL (ref 0.0–0.5)
Eosinophils Relative: 4 %
HCT: 39.1 % (ref 39.0–52.0)
Hemoglobin: 12.9 g/dL — ABNORMAL LOW (ref 13.0–17.0)
Immature Granulocytes: 0 %
Lymphocytes Relative: 34 %
Lymphs Abs: 2 K/uL (ref 0.7–4.0)
MCH: 30.9 pg (ref 26.0–34.0)
MCHC: 33 g/dL (ref 30.0–36.0)
MCV: 93.5 fL (ref 80.0–100.0)
Monocytes Absolute: 0.5 K/uL (ref 0.1–1.0)
Monocytes Relative: 9 %
Neutro Abs: 3.2 K/uL (ref 1.7–7.7)
Neutrophils Relative %: 52 %
Platelets: 303 K/uL (ref 150–400)
RBC: 4.18 MIL/uL — ABNORMAL LOW (ref 4.22–5.81)
RDW: 14.9 % (ref 11.5–15.5)
WBC: 6 K/uL (ref 4.0–10.5)
nRBC: 0 % (ref 0.0–0.2)

## 2024-10-09 LAB — COMPREHENSIVE METABOLIC PANEL WITH GFR
ALT: 221 U/L — ABNORMAL HIGH (ref 0–44)
AST: 358 U/L — ABNORMAL HIGH (ref 15–41)
Albumin: 3.9 g/dL (ref 3.5–5.0)
Alkaline Phosphatase: 48 U/L (ref 38–126)
Anion gap: 10 (ref 5–15)
BUN: 11 mg/dL (ref 6–20)
CO2: 24 mmol/L (ref 22–32)
Calcium: 9.1 mg/dL (ref 8.9–10.3)
Chloride: 102 mmol/L (ref 98–111)
Creatinine, Ser: 0.86 mg/dL (ref 0.61–1.24)
GFR, Estimated: >60 mL/min (ref >60)
Glucose, Bld: 85 mg/dL (ref 70–99)
Potassium: 4.2 mmol/L (ref 3.5–5.1)
Sodium: 136 mmol/L (ref 135–145)
Total Bilirubin: 0.4 mg/dL (ref 0.0–1.2)
Total Protein: 6.9 g/dL (ref 6.5–8.1)

## 2024-10-09 LAB — BRAIN NATRIURETIC PEPTIDE: B Natriuretic Peptide: 12.9 pg/mL (ref 0.0–100.0)

## 2024-10-09 LAB — CK: Total CK: >20000 U/L — ABNORMAL HIGH (ref 49–397)

## 2024-10-09 MED ORDER — METHOCARBAMOL 500 MG PO TABS: 500.0000 mg | ORAL_TABLET | Freq: Three times a day (TID) | ORAL | Status: DC | PRN

## 2024-10-09 MED ORDER — ALBUTEROL SULFATE (2.5 MG/3ML) 0.083% IN NEBU: 2.5000 mg | INHALATION_SOLUTION | RESPIRATORY_TRACT | Status: DC | PRN

## 2024-10-09 MED ORDER — SODIUM CHLORIDE 0.9 % IV SOLN: INTRAVENOUS | Status: AC

## 2024-10-09 MED ORDER — SODIUM CHLORIDE 0.9 % IV BOLUS
1000.0000 mL | Freq: Once | INTRAVENOUS | Status: AC
  Administered 2024-10-09: 1000 mL via INTRAVENOUS

## 2024-10-09 MED ORDER — ONDANSETRON HCL 4 MG/2ML IJ SOLN: 4.0000 mg | Freq: Four times a day (QID) | INTRAMUSCULAR | Status: DC | PRN

## 2024-10-09 MED ORDER — ACETAMINOPHEN 500 MG PO TABS: 1000.0000 mg | ORAL_TABLET | Freq: Four times a day (QID) | ORAL | Status: DC | PRN

## 2024-10-09 MED ORDER — POLYETHYLENE GLYCOL 3350 17 G PO PACK: 17.0000 g | PACK | Freq: Every day | ORAL | Status: DC | PRN

## 2024-10-09 MED ORDER — SODIUM CHLORIDE 0.9% FLUSH
3.0000 mL | Freq: Two times a day (BID) | INTRAVENOUS | Status: DC
  Administered 2024-10-11 – 2024-10-12 (×2): 3 mL via INTRAVENOUS

## 2024-10-09 MED ORDER — ENOXAPARIN SODIUM 40 MG/0.4ML IJ SOSY
40.0000 mg | PREFILLED_SYRINGE | INTRAMUSCULAR | Status: DC
  Administered 2024-10-10 – 2024-10-12 (×3): 40 mg via SUBCUTANEOUS
  Filled 2024-10-09 (×3): qty 0.4

## 2024-10-09 MED ORDER — MELATONIN 3 MG PO TABS: 6.0000 mg | ORAL_TABLET | Freq: Every evening | ORAL | Status: DC | PRN

## 2024-10-09 NOTE — H&P (Signed)
 History and Physical    Paul Prince FMW:[id number] DOB: 05-21-1976 DOA: 10/09/2024  PCP: Rollene Almarie LABOR, MD   Patient coming from: Home   Chief Complaint:  Chief Complaint  Patient presents with   Abn Labs   Bil arm swelling    HPI:  Paul Prince is a 48 y.o. male with hx of hypertension, depression, migraines who presented with arm pain and swelling to urgent care yesterday and was referred to the ED due to findings of rhabdomyolysis. Reports that he recently started going to the gym after a hiatus. He began working out on Monday 10/20, and worked out [employer] as well. He did isolated upper extremity exercises, including bicep curls, Tricep extensions, rows, ~ 25-30 lb weight but lot of reps. Noted that he had lasting pain after exercising and significant swelling in his upper extremities, which has gradually gone down. No recent illness, fever, chills / cough / cold. No new medications, takes Emgality, Sumatriptan  for migraines, no sumatriptan  in the past week, Takes occasional goody powder for migraines. No drug use.  Review of Systems:  ROS complete and negative except as marked above   Allergies  Allergen Reactions   Verapamil      Stated causing bruising    Prior to Admission medications   Medication Sig Start Date End Date Taking? Authorizing Provider  EMGALITY, 300 MG DOSE, 100 MG/ML SOSY  02/13/19   [provider]  SUMAtriptan  6 MG/0.5ML SOAJ 1 injection every 2 hours AS NEEDED.  LIMIT 2/DAY 04/10/24   [provider]    Past Medical History:  Diagnosis Date   Chronic headache     Past Surgical History:  Procedure Laterality Date   EXAMINATION UNDER ANESTHESIA N/A 08/11/2014   Procedure: EXAM UNDER ANESTHESIA;  Surgeon: Dann FORBES Hummer, MD;  Location: [facility] OR;  Service: General;  Laterality: N/A;   INCISION AND DRAINAGE PERIRECTAL ABSCESS  08/11/2014   Procedure: IRRIGATION AND DEBRIDEMENT PERIRECTAL ABSCESS;  Surgeon: Dann FORBES Hummer, MD;   Location: [facility] OR;  Service: General;;     reports that he has been smoking cigars and cigarettes. He has a 20 pack-year smoking history. He has never used smokeless tobacco. He reports current alcohol use. He reports that he does not use drugs.  Family History  Problem Relation Age of Onset   Bipolar disorder Mother    Heart attack Father      Physical Exam: Vitals:   10/09/24 1637 10/09/24 1757 10/09/24 2300 10/09/24 2317  BP: (!) 156/81  (!) 154/90 (!) 146/87  Pulse: 95   76  Resp: 14   (!) 23  Temp: 98.2 F (36.8 C)   98.2 F (36.8 C)  TempSrc:    Oral  SpO2: 100%   100%  Weight:  93 kg    Height:  6' (1.829 m)      Gen: Awake, alert, NAD   CV: Regular, normal S1, S2, no murmurs  Resp: Normal WOB, CTAB  Abd: Flat, normoactive, nontender MSK: Upper extremities are mildly edematous, and upper and lower arm compartments are full but not firm or taught. Radial pulses are 2+ bilaterally. Lower extremities are soft, and no edema.  Skin: No rashes or lesions to exposed skin  Neuro: Alert and interactive  Psych: euthymic, appropriate    Data review:   Labs reviewed, notable for:  \ CK 18112 -> greater than 20,000 K4.2 Creatinine 0.86 AST 358, ALT 221   Micro:  Results for orders placed or performed during the  hospital encounter of 08/10/14  Surgical pcr screen     Status: None   Collection Time: 08/11/14  8:26 AM   Specimen: Nasal Mucosa; Nasal Swab  Result Value Ref Range Status   MRSA, PCR NEGATIVE NEGATIVE Final   Staphylococcus aureus NEGATIVE NEGATIVE Final    Comment:        The Xpert SA Assay (FDA approved for NASAL specimens in patients over 79 years of age), is one component of a comprehensive surveillance program.  Test performance has been validated by [employer] for patients greater than or equal to 45 year old. It is not intended to diagnose infection nor to guide or monitor treatment.  Culture, routine-abscess     Status: None   Collection  Time: 08/11/14  9:33 AM  Result Value Ref Range Status   Specimen Description ABSCESS PERIRECTAL  Final   Special Requests PATIENT ON FOLLOWING ZOSYN   Final   Gram Stain   Final    NO WBC SEEN NO SQUAMOUS EPITHELIAL CELLS SEEN NO ORGANISMS SEEN Performed at [facility]   Culture   Final    NO GROWTH 3 DAYS Performed at [facility]   Report Status 08/15/2014 FINAL  Final  Anaerobic culture     Status: None   Collection Time: 08/11/14  9:33 AM  Result Value Ref Range Status   Specimen Description ABSCESS PERIRECTAL  Final   Special Requests PATIENT ON FOLLOWING ZOSYN   Final   Gram Stain   Final    NO WBC SEEN NO SQUAMOUS EPITHELIAL CELLS SEEN NO ORGANISMS SEEN Performed at [facility]   Culture   Final    NO ANAEROBES ISOLATED Performed at [facility]   Report Status 08/16/2014 FINAL  Final    Imaging reviewed:  DG Chest 2 View Result Date: 10/09/2024 EXAM: 2 VIEW(S) XRAY OF THE CHEST 10/09/2024 06:56:00 PM COMPARISON: None available. CLINICAL HISTORY: edema. Triage notes:Patient sent from UC to get labs and a IV. Patient reports that his arms are swollen. They have been swollen for the past 4-5 days.  FINDINGS: LUNGS AND PLEURA: No focal pulmonary opacity. No pulmonary edema. No pleural effusion. No pneumothorax. HEART AND MEDIASTINUM: No acute abnormality of the cardiac and mediastinal silhouettes. BONES AND SOFT TISSUES: No acute osseous abnormality. IMPRESSION: 1. No acute process. Electronically signed by: Dorethia Molt MD 10/09/2024 07:26 PM EDT RP Workstation: [id number]   ED Course:  Treated with 2 L NS   Assessment/Plan:  48 y.o. male with hx hypertension, depression, migraines who presented with arm pain and swelling to urgent care yesterday and was referred to the ED due to findings of rhabdomyolysis.  Nontraumatic exertional rhabdomyolysis Transaminitis presumed related to above - S/p 2 L NS, continue NS at 200 cc an hour,  trend CK -- Symptom mgmt: Tylenol  prn mild, methocarbamol  for spasm, heat.  - Activity modification upon returning back to home and resuming exercise regimen, activities to tolerance, advised to start back with cardiovascular exercise first, limit strenuous weight lifting and increase gradually  Chronic medical problems: Hypertension: Not on current medication Depression: Not on medication Migraines: On Emgality and sumatriptan  outpatient, would avoid sumatriptan  while recovering from rhabdo  Body mass index is 27.8 kg/m.    DVT prophylaxis:  Lovenox  Code Status:  Full Code Diet:  Diet Orders (From admission, onward)    None      Family Communication:  None   Consults:  None   Admission status:   Inpatient, Med-Surg  Severity of Illness: The appropriate patient status for this patient is INPATIENT. Inpatient status is judged to be reasonable and necessary in order to provide the required intensity of service to ensure the patient's safety. The patient's presenting symptoms, physical exam findings, and initial radiographic and laboratory data in the context of their chronic comorbidities is felt to place them at high risk for further clinical deterioration. Furthermore, it is not anticipated that the patient will be medically stable for discharge from the hospital within 2 midnights of admission.   * I certify that at the point of admission it is my clinical judgment that the patient will require inpatient hospital care spanning beyond 2 midnights from the point of admission due to high intensity of service, high risk for further deterioration and high frequency of surveillance required.*   Dorn Dawson, MD [facility]  How to contact the [facility] Attending or Consulting provider 7A - 7P or covering provider during after hours 7P -7A, for this patient.  Check the care team in [facility] and look for a) attending/consulting [facility] provider listed and b) the [facility] team listed Log into  [email or web address] and use 's universal password to access. If you do not have the password, please contact the hospital operator. Locate the [facility] provider you are looking for under [facility] and page to a number that you can be directly reached. If you still have difficulty reaching the provider, please page the [facility] (Director on Call) for the Hospitalists listed on amion for assistance.  10/09/2024, 11:21 PM

## 2024-10-09 NOTE — ED Triage Notes (Signed)
 Pt came in via POV d/t UC informing him that he had some abnormal labs & his UA looks like he was dehydrated. He initially gave them a visit d/t swelling in bil arms after going to the gym, states he didn't do any more strenuous activity than usual, he just started back at the gym after not going for about a year. A/Ox4, denies any acute pain just discomfort from the swelling in his arms.

## 2024-10-09 NOTE — ED Provider Triage Note (Signed)
 Emergency Medicine Provider Triage Evaluation Note  Paul Prince , a 48 y.o. male  was evaluated in triage.  Pt complains of arm swelling. Endorse swelling to both arms along with tightness sensation x 5 days after working out.  No fever, chills, cp, sob, leg swelling.  Did do upper body work out but nothing out of the ordinary. Denies drug use or testosterone use.  No hx of CHF  Review of Systems  Positive: As above Negative: As above  Physical Exam  BP (!) 156/81 (BP Location: Right Arm)   Pulse 95   Temp 98.2 F (36.8 C)   Resp 14   Ht 6' (1.829 m)   Wt 93 kg   SpO2 100%   BMI 27.80 kg/m  Gen:   Awake, no distress   Resp:  Normal effort  MSK:   Moves extremities without difficulty  Other:    Medical Decision Making  Medically screening exam initiated at 6:24 PM.  Appropriate orders placed.  Koleman Marling was informed that the remainder of the evaluation will be completed by another provider, this initial triage assessment does not replace that evaluation, and the importance of remaining in the ED until their evaluation is complete.     Nivia Colon, PA-C 10/09/24 [phone]

## 2024-10-09 NOTE — ED Triage Notes (Signed)
 Patient sent from UC to get labs and a IV. Patient reports that his arms are swollen. They have been swollen for the past 4-5 days.

## 2024-10-09 NOTE — ED Provider Notes (Signed)
 [place] EMERGENCY DEPARTMENT AT [facility] Provider Note   CSN: [id number] Arrival date & time: 10/09/24  1629     Patient presents with: Abn Labs and Bil arm swelling   Paul Prince is a 48 y.o. male.   The history is provided by the patient and medical records.   48 year old male with history of ongoing headaches, presenting to the ED for bilateral arm swelling.  Patient states a few days ago he did a vigorous upper body workout, however this was nothing out of the ordinary for him.  States for 2 days afterwards he was very sore which is not uncommon for him.  States he did have some swelling of the arms and thought this was just due to pump and would go down but seems to be getting worse.  Right arm is slightly worse than left.  He was seen at urgent care yesterday and had abnormal labs and sent to the ER for further management.  Prior to Admission medications   Medication Sig Start Date End Date Taking? Authorizing Provider  EMGALITY, 300 MG DOSE, 100 MG/ML SOSY  02/13/19   [provider]  SUMAtriptan  6 MG/0.5ML SOAJ 1 injection every 2 hours AS NEEDED.  LIMIT 2/DAY 04/10/24   [provider]    Allergies: Verapamil     Review of Systems  Musculoskeletal:  Positive for myalgias.       Arm swelling  All other systems reviewed and are negative.   Updated Vital Signs BP (!) 156/81 (BP Location: Right Arm)   Pulse 95   Temp 98.2 F (36.8 C)   Resp 14   Ht 6' (1.829 m)   Wt 93 kg   SpO2 100%   BMI 27.80 kg/m   Physical Exam Vitals and nursing note reviewed.  Constitutional:      Appearance: He is well-developed.  HENT:     Head: Normocephalic and atraumatic.  Eyes:     Conjunctiva/sclera: Conjunctivae normal.     Pupils: Pupils are equal, round, and reactive to light.  Cardiovascular:     Rate and Rhythm: Normal rate and regular rhythm.     Heart sounds: Normal heart sounds.  Pulmonary:     Effort: Pulmonary effort is normal.      Breath sounds: Normal breath sounds.  Abdominal:     General: Bowel sounds are normal.     Palpations: Abdomen is soft.  Musculoskeletal:        General: Normal range of motion.     Cervical back: Normal range of motion.     Comments: Mild edema of BUE, right slightly worse than left, no tissue crepitus, normal ROM, pulses intact BUE  Skin:    General: Skin is warm and dry.  Neurological:     Mental Status: He is alert and oriented to person, place, and time.     (all labs ordered are listed, but only abnormal results are displayed) Labs Reviewed  COMPREHENSIVE METABOLIC PANEL WITH GFR - Abnormal; Notable for the following components:      Result Value   AST 358 (*)    ALT 221 (*)    All other components within normal limits  CBC WITH DIFFERENTIAL/PLATELET - Abnormal; Notable for the following components:   RBC 4.18 (*)    Hemoglobin 12.9 (*)    All other components within normal limits  CK - Abnormal; Notable for the following components:   Total CK >20,000 (*)    All other components within  normal limits  BRAIN NATRIURETIC PEPTIDE  URINALYSIS, ROUTINE W REFLEX MICROSCOPIC  HIV ANTIBODY (ROUTINE TESTING W REFLEX)  TSH  CK  RAPID URINE DRUG SCREEN, HOSP PERFORMED    EKG: None  Radiology: DG Chest 2 View Result Date: 10/09/2024 EXAM: 2 VIEW(S) XRAY OF THE CHEST 10/09/2024 06:56:00 PM COMPARISON: None available. CLINICAL HISTORY: edema. Triage notes:Patient sent from UC to get labs and a IV. Patient reports that his arms are swollen. They have been swollen for the past 4-5 days.  FINDINGS: LUNGS AND PLEURA: No focal pulmonary opacity. No pulmonary edema. No pleural effusion. No pneumothorax. HEART AND MEDIASTINUM: No acute abnormality of the cardiac and mediastinal silhouettes. BONES AND SOFT TISSUES: No acute osseous abnormality. IMPRESSION: 1. No acute process. Electronically signed by: Dorethia Molt MD 10/09/2024 07:26 PM EDT RP Workstation: [id number]      Procedures   CRITICAL CARE Performed by: Olam CHRISTELLA Slocumb   Total critical care time: 35 minutes  Critical care time was exclusive of separately billable procedures and treating other patients.  Critical care was necessary to treat or prevent imminent or life-threatening deterioration.  Critical care was time spent personally by me on the following activities: development of treatment plan with patient and/or surrogate as well as nursing, discussions with consultants, evaluation of patient's response to treatment, examination of patient, obtaining history from patient or surrogate, ordering and performing treatments and interventions, ordering and review of laboratory studies, ordering and review of radiographic studies, pulse oximetry and re-evaluation of patient's condition.   Medications Ordered in the ED  0.9 %  sodium chloride  infusion (has no administration in time range)  enoxaparin  (LOVENOX ) injection 40 mg (has no administration in time range)  sodium chloride  flush (NS) 0.9 % injection 3 mL (has no administration in time range)  acetaminophen  (TYLENOL ) tablet 1,000 mg (has no administration in time range)  albuterol (PROVENTIL) (2.5 MG/3ML) 0.083% nebulizer solution 2.5 mg (has no administration in time range)  melatonin tablet 6 mg (has no administration in time range)  ondansetron  (ZOFRAN ) injection 4 mg (has no administration in time range)  polyethylene glycol (MIRALAX  / GLYCOLAX ) packet 17 g (has no administration in time range)  methocarbamol  (ROBAXIN ) tablet 500 mg (has no administration in time range)  sodium chloride  0.9 % bolus 1,000 mL (1,000 mLs Intravenous New Bag/Given 10/09/24 2305)  sodium chloride  0.9 % bolus 1,000 mL (1,000 mLs Intravenous New Bag/Given 10/09/24 2305)                                    Medical Decision Making Amount and/or Complexity of Data Reviewed Labs: ordered. Radiology: ordered and independent interpretation  performed. ECG/medicine tests: ordered and independent interpretation performed.  Risk Decision regarding hospitalization.   48 y.o. M here with BUE swelling after vigorous work-out several days ago.  Does have some muscle soreness. No chest pain or SOB.    Afebrile, non-toxic in appearance here.  Does have mild edema of BUE, right slightly worse than left.  No palpable cords, no tissue crepitus, arms remain NVI.    Labs as above--no leukocytosis.  LFTs are elevated, not entirely unexpected.  Normal creatinine.  UA yesterday at urgent care with blood which I suspect is myoglobin.  CK >20,000.  Will initiate aggressive IV hydration.  He will require admission.  Discussed with hospitalist, Dr. Segars-- will admit for ongoing care.  Final diagnoses:  Non-traumatic rhabdomyolysis  ED Discharge Orders     None          Jarold Olam HERO, PA-C 10/09/24 2341    Pamella Ozell LABOR, DO 10/14/24 2311

## 2024-10-10 DIAGNOSIS — M6282 Rhabdomyolysis: Secondary | ICD-10-CM | POA: Diagnosis not present

## 2024-10-10 LAB — URINALYSIS, ROUTINE W REFLEX MICROSCOPIC
Bilirubin Urine: NEGATIVE
Glucose, UA: NEGATIVE mg/dL
Hgb urine dipstick: NEGATIVE
Ketones, ur: NEGATIVE mg/dL
Leukocytes,Ua: NEGATIVE
Nitrite: NEGATIVE
Protein, ur: NEGATIVE mg/dL
Specific Gravity, Urine: 1.019 (ref 1.005–1.030)
pH: 6 (ref 5.0–8.0)

## 2024-10-10 LAB — RENAL FUNCTION PANEL
Albumin: 3.3 g/dL — ABNORMAL LOW (ref 3.5–5.0)
Anion gap: 8 (ref 5–15)
BUN: 9 mg/dL (ref 6–20)
CO2: 23 mmol/L (ref 22–32)
Calcium: 8.3 mg/dL — ABNORMAL LOW (ref 8.9–10.3)
Chloride: 108 mmol/L (ref 98–111)
Creatinine, Ser: 0.79 mg/dL (ref 0.61–1.24)
GFR, Estimated: >60 mL/min (ref >60)
Glucose, Bld: 82 mg/dL (ref 70–99)
Phosphorus: 3.8 mg/dL (ref 2.5–4.6)
Potassium: 3.9 mmol/L (ref 3.5–5.1)
Sodium: 139 mmol/L (ref 135–145)

## 2024-10-10 LAB — HEPATIC FUNCTION PANEL
ALT: 182 U/L — ABNORMAL HIGH (ref 0–44)
AST: 270 U/L — ABNORMAL HIGH (ref 15–41)
Albumin: 3.3 g/dL — ABNORMAL LOW (ref 3.5–5.0)
Alkaline Phosphatase: 40 U/L (ref 38–126)
Bilirubin, Direct: <0.1 mg/dL (ref 0.0–0.2)
Total Bilirubin: 0.2 mg/dL (ref 0.0–1.2)
Total Protein: 5.9 g/dL — ABNORMAL LOW (ref 6.5–8.1)

## 2024-10-10 LAB — RAPID URINE DRUG SCREEN, HOSP PERFORMED
Amphetamines: NOT DETECTED
Barbiturates: NOT DETECTED
Benzodiazepines: NOT DETECTED
Cocaine: NOT DETECTED
Opiates: NOT DETECTED
Tetrahydrocannabinol: NOT DETECTED

## 2024-10-10 LAB — CK: Total CK: 16830 U/L — ABNORMAL HIGH (ref 49–397)

## 2024-10-10 LAB — HIV ANTIBODY (ROUTINE TESTING W REFLEX): HIV Screen 4th Generation wRfx: NONREACTIVE

## 2024-10-10 NOTE — ED Notes (Signed)
 Pt provided with urinal, call bell in reach.

## 2024-10-10 NOTE — Progress Notes (Signed)
 New Admission Note:  Arrival Method: By stretcher from ED around 0345 Mental Orientation: Alert and oriented x 4  Telemetry: None Assessment: Completed Skin: Completed, refer to flowsheets IV: R AC Pain: None Tubes: None Safety Measures: Safety Fall Prevention Plan was given, discussed and signed. Admission: Completed [address] Orientation: Patient has been orientated to the room, unit and the staff. Family: None  Orders have been reviewed and implemented. Will continue to monitor the patient. Call light has been placed within reach and bed alarm has been activated.   Bari Lor, RN  Phone Number: [phone]

## 2024-10-10 NOTE — Progress Notes (Signed)
 [facility]   ROUNDING   NOTE Paul Prince FMW:[id number]  DOB: 1976/07/28  DOA: 10/09/2024  PCP: Rollene Almarie LABOR, MD  10/10/2024,9:42 AM  LOS: 1 day    Code Status: Full code     from: Home   48 year old male 2015 resected perirectal abscess Sciatica/radiculopathy followed by sports medicine Migraine/cluster headaches Previous painless hematuria no clots-no pain  Chronology  10/27 presented from urgent care with swelling of arms and pain in the setting of very vigorous unaccustomed gym workout doing high reps Found to have rhabdomyolysis CK >20,000  Pertinent imaging/studies till date  10/27 CXR no acute process-urine analysis negative   Assessment  & Plan :    Severe rhabdomyolysis Copious IV fluid-200 cc/18 cut back as patient's transaminitis and liver function improved Expect full recovery Patient cautioned about intense exercise without regard for hydration or slow build up to conditioning Sparing use of methocarbamol  Migraine headaches Avoid NSAIDs for now can resume as an outpatient Clinically stable sciatica   Not sure why someone checked a TSH-follow in any case  Data Reviewed today:  BUN/creatinine 9/0.7 AST/ALT 270/1.8 phosphorus 3.8 CK total 1 16,830 HIV nonreactive urine analysis negative   DVT prophylaxis: Lovenox   Status is: Inpatient Inpatient until rhabdo is resolved with labs less than 5000    Dispo/Global plan: Likely home 1-2   Time 50   Subjective:   Arms still swollen--no cp fever n/v Passing good urine    Objective + exam Vitals:   10/09/24 2317 10/10/24 0105 10/10/24 0353 10/10/24 0816  BP: (!) 146/87 139/82 135/73 136/76  Pulse: 76 72 66 78  Resp: (!) 23 20  18   Temp: 98.2 F (36.8 C)  97.7 F (36.5 C) 99.1 F (37.3 C)  TempSrc: Oral  Oral   SpO2: 100% 100% 100% 99%  Weight:      Height:       Filed Weights   10/09/24 1757  Weight: 93 kg     Examination: EOMI NCAT no focal deficit no icterus no pallor Chest is  clear Arms appear swollen Abdomen soft no rebound no guarding No lower extremity edema  Scheduled Meds:  enoxaparin  (LOVENOX ) injection  40 mg Subcutaneous Q24H   sodium chloride  flush  3 mL Intravenous Q12H   Continuous Infusions:  sodium chloride  200 mL/hr at 10/10/24 0845   acetaminophen , albuterol, melatonin, methocarbamol , ondansetron  (ZOFRAN ) IV, polyethylene glycol  Jai-Gurmukh Addilynn Mowrer, MD  [facility]

## 2024-10-11 DIAGNOSIS — M6282 Rhabdomyolysis: Secondary | ICD-10-CM | POA: Diagnosis not present

## 2024-10-11 LAB — COMPREHENSIVE METABOLIC PANEL WITH GFR
ALT: 157 U/L — ABNORMAL HIGH (ref 0–44)
AST: 182 U/L — ABNORMAL HIGH (ref 15–41)
Albumin: 3.4 g/dL — ABNORMAL LOW (ref 3.5–5.0)
Alkaline Phosphatase: 42 U/L (ref 38–126)
Anion gap: 9 (ref 5–15)
BUN: 10 mg/dL (ref 6–20)
CO2: 23 mmol/L (ref 22–32)
Calcium: 9.1 mg/dL (ref 8.9–10.3)
Chloride: 104 mmol/L (ref 98–111)
Creatinine, Ser: 0.79 mg/dL (ref 0.61–1.24)
GFR, Estimated: >60 mL/min (ref >60)
Glucose, Bld: 92 mg/dL (ref 70–99)
Potassium: 4 mmol/L (ref 3.5–5.1)
Sodium: 136 mmol/L (ref 135–145)
Total Bilirubin: 0.3 mg/dL (ref 0.0–1.2)
Total Protein: 6 g/dL — ABNORMAL LOW (ref 6.5–8.1)

## 2024-10-11 LAB — CK: Total CK: 9501 U/L — ABNORMAL HIGH (ref 49–397)

## 2024-10-11 MED ORDER — SODIUM CHLORIDE 0.9 % IV SOLN: INTRAVENOUS | Status: DC

## 2024-10-11 NOTE — Plan of Care (Signed)
  Problem: Skin Integrity: Goal: Risk for impaired skin integrity will decrease Outcome: Progressing   Problem: Safety: Goal: Ability to remain free from injury will improve Outcome: Progressing   Problem: Pain Managment: Goal: General experience of comfort will improve and/or be controlled Outcome: Progressing   Problem: Elimination: Goal: Will not experience complications related to bowel motility Outcome: Progressing Goal: Will not experience complications related to urinary retention Outcome: Progressing   Problem: Coping: Goal: Level of anxiety will decrease Outcome: Progressing   Problem: Nutrition: Goal: Adequate nutrition will be maintained Outcome: Progressing   Problem: Activity: Goal: Risk for activity intolerance will decrease Outcome: Progressing   Problem: Clinical Measurements: Goal: Ability to maintain clinical measurements within normal limits will improve Outcome: Progressing Goal: Will remain free from infection Outcome: Progressing Goal: Diagnostic test results will improve Outcome: Progressing Goal: Respiratory complications will improve Outcome: Progressing Goal: Cardiovascular complication will be avoided Outcome: Progressing   Problem: Education: Goal: Knowledge of General Education information will improve Description: Including pain rating scale, medication(s)/side effects and non-pharmacologic comfort measures Outcome: Progressing   Problem: Pain Managment: Goal: General experience of comfort will improve and/or be controlled Outcome: Progressing   Problem: Nutrition: Goal: Adequate nutrition will be maintained Outcome: Progressing   Problem: Clinical Measurements: Goal: Will remain free from infection Outcome: Progressing   Problem: Clinical Measurements: Goal: Diagnostic test results will improve Outcome: Progressing   Problem: Clinical Measurements: Goal: Respiratory complications will improve Outcome: Progressing

## 2024-10-11 NOTE — Plan of Care (Signed)
  Problem: Education: Goal: Knowledge of General Education information will improve Description: Including pain rating scale, medication(s)/side effects and non-pharmacologic comfort measures Outcome: Progressing   Problem: Clinical Measurements: Goal: Ability to maintain clinical measurements within normal limits will improve Outcome: Progressing   Problem: Activity: Goal: Risk for activity intolerance will decrease Outcome: Progressing   Problem: Elimination: Goal: Will not experience complications related to bowel motility Outcome: Progressing Goal: Will not experience complications related to urinary retention Outcome: Progressing   Problem: Pain Managment: Goal: General experience of comfort will improve and/or be controlled Outcome: Progressing   Problem: Safety: Goal: Ability to remain free from injury will improve Outcome: Progressing   Problem: Skin Integrity: Goal: Risk for impaired skin integrity will decrease Outcome: Progressing

## 2024-10-11 NOTE — Plan of Care (Signed)
  Problem: Education: Goal: Knowledge of General Education information will improve Description: Including pain rating scale, medication(s)/side effects and non-pharmacologic comfort measures Outcome: Progressing   Problem: Health Behavior/Discharge Planning: Goal: Ability to manage health-related needs will improve Outcome: Progressing   Problem: Clinical Measurements: Goal: Ability to maintain clinical measurements within normal limits will improve Outcome: Progressing   Problem: Activity: Goal: Risk for activity intolerance will decrease Outcome: Progressing   Problem: Nutrition: Goal: Adequate nutrition will be maintained Outcome: Progressing   Problem: Coping: Goal: Level of anxiety will decrease Outcome: Progressing   Problem: Elimination: Goal: Will not experience complications related to bowel motility Outcome: Progressing   Problem: Pain Managment: Goal: General experience of comfort will improve and/or be controlled Outcome: Progressing

## 2024-10-11 NOTE — Progress Notes (Signed)
 PROGRESS NOTE  Paul Prince  DOB: [birth year]  PCP: Rollene Almarie LABOR, MD FMW:[id number]  DOA: 10/09/2024  LOS: 2 days  Hospital Day: 3  Subjective: Patient was seen and examined this morning.  Middle-aged African-American male.  Lying down in bed.  Not in distress.  Muscle soreness in both arms improving. Chart reviewed. Afebrile, heart rate in 60s and 70s, blood pressure in 140s today, breathing room air Labs from this morning with downtrending CK, AST, ALT, renal function normal  Brief narrative: Paul Prince is a 48 y.o. male with PMH significant for HTN, depression, migraine/cluster headache, sciatica.  10/26, patient presented to [facility] health urgent care with complaint of bilateral arm swelling progressive for 4 days after lifting weights more than his usual. With concern of rhabdomyolysis, he was sent to the ED. On initial workup, his renal function was normal, CK was elevated to 18,000, AST/ALT were elevated Urinalysis unremarkable, negative for protein UDS negative Started on IV fluids Admitted to [facility]  Assessment and plan: Severe rhabdomyolysis Elevated transaminases Rhabdomyolysis secondary to strenuous workout CK level and transaminases level were elevated but renal function normal With IV hydration, CK, AST, ALT levels are downtrending as below.  It seems fluid got discontinued last night and was not resumed.  I have restarted him on normal saline at 125 mL/h.  To repeat CK and LFTs tomorrow. Muscle soreness improving. Recent Labs  Lab 10/08/24 1412 10/09/24 1824 10/10/24 0517 10/11/24 0412  CKTOTAL 18,112* >20,000* 16,830* 9,501*   Recent Labs  Lab 10/08/24 1412 10/09/24 1824 10/10/24 0517 10/11/24 0412  AST 491* 358* 270* 182*  ALT 239* 221* 182* 157*  ALKPHOS 49 48 40 42  BILITOT 0.6 0.4 0.2 0.3  PROT 6.8 6.9 5.9* 6.0*  ALBUMIN 3.9 3.9 3.3*  3.3* 3.4*    Migraine headaches Avoid NSAIDs for now  can resume as an  outpatient  Sciatica Clinically stable     Mobility: Encourage ambulation PT Orders:   PT Follow up Rec:    Goals of care   Code Status: Full Code     DVT prophylaxis:  enoxaparin  (LOVENOX ) injection 40 mg Start: 10/10/24 1000   Antimicrobials: None Fluid: NS at 125 mL/h Consultants: None Family Communication: None at bedside  Status: Inpatient Level of care:  Med-Surg   Patient is from: Home Needs to continue in-hospital care: Needs IV hydration Anticipated d/c to: Hopefully home in 1 to 2 days      Diet:  Diet Order             Diet regular Room service appropriate? Yes; Fluid consistency: Thin  Diet effective now                   Scheduled Meds:  enoxaparin  (LOVENOX ) injection  40 mg Subcutaneous Q24H   sodium chloride  flush  3 mL Intravenous Q12H    PRN meds: acetaminophen , albuterol, melatonin, methocarbamol , ondansetron  (ZOFRAN ) IV, polyethylene glycol   Infusions:   sodium chloride  125 mL/hr at 10/11/24 1034    Antimicrobials: Anti-infectives (From admission, onward)    None       Objective: Vitals:   10/11/24 0409 10/11/24 0807  BP: (!) 146/78 (!) 146/88  Pulse: 63 69  Resp: 18 18  Temp: 98.5 F (36.9 C) (!) 97.5 F (36.4 C)  SpO2: 100% 100%    Intake/Output Summary (Last 24 hours) at 10/11/2024 1317 Last data filed at 10/11/2024 0900 Gross per 24 hour  Intake 799.86 ml  Output 400 ml  Net 399.86 ml   Filed Weights   10/09/24 1757  Weight: 93 kg   Weight change:  Body mass index is 27.8 kg/m.   Physical Exam: General exam: Pleasant, middle-aged African-American male Skin: No rashes, lesions or ulcers. HEENT: Atraumatic, normocephalic, no obvious bleeding Lungs: Clear to auscultation bilaterally,  CVS: S1, S2, no murmur,   GI/Abd: Soft, nontender, nondistended, bowel sound present,   CNS: Alert, awake, oriented x 3 Psychiatry: Mood appropriate Extremities: No pedal edema, no calf tenderness, bilateral arm  soreness improving  Data Review: I have personally reviewed the laboratory data and studies available.  F/u labs ordered Unresulted Labs (From admission, onward)     Start     Ordered   10/11/24 0500  Comprehensive metabolic panel with GFR  Daily,   R     Question:  Specimen collection method  Answer:  Lab=Lab collect   10/10/24 1618   10/11/24 0500  CK  Daily,   R     Question:  Specimen collection method  Answer:  Lab=Lab collect   10/10/24 1618   10/10/24 0500  TSH  Tomorrow morning,   R        10/09/24 2333            Signed, Chapman Rota, MD [facility] 10/11/2024

## 2024-10-12 DIAGNOSIS — M6282 Rhabdomyolysis: Secondary | ICD-10-CM | POA: Diagnosis not present

## 2024-10-12 LAB — COMPREHENSIVE METABOLIC PANEL WITH GFR
ALT: 134 U/L — ABNORMAL HIGH (ref 0–44)
AST: 118 U/L — ABNORMAL HIGH (ref 15–41)
Albumin: 3.4 g/dL — ABNORMAL LOW (ref 3.5–5.0)
Alkaline Phosphatase: 45 U/L (ref 38–126)
Anion gap: 10 (ref 5–15)
BUN: 9 mg/dL (ref 6–20)
CO2: 25 mmol/L (ref 22–32)
Calcium: 8.7 mg/dL — ABNORMAL LOW (ref 8.9–10.3)
Chloride: 104 mmol/L (ref 98–111)
Creatinine, Ser: 0.84 mg/dL (ref 0.61–1.24)
GFR, Estimated: >60 mL/min (ref >60)
Glucose, Bld: 89 mg/dL (ref 70–99)
Potassium: 3.9 mmol/L (ref 3.5–5.1)
Sodium: 139 mmol/L (ref 135–145)
Total Bilirubin: 0.2 mg/dL (ref 0.0–1.2)
Total Protein: 5.9 g/dL — ABNORMAL LOW (ref 6.5–8.1)

## 2024-10-12 LAB — CK: Total CK: 4821 U/L — ABNORMAL HIGH (ref 49–397)

## 2024-10-12 NOTE — Discharge Summary (Signed)
 Physician Discharge Summary  Paul Prince FMW:[id number] DOB: 1976/08/09 DOA: 10/09/2024  PCP: Rollene Almarie LABOR, MD  Admit date: 10/09/2024 Discharge date: 10/12/2024  Admitted from: Home Discharge disposition: Home  Recommendations at discharge:  Encourage oral hydration Follow-up with PCP next 1 to 2 weeks to repeat CK level Avoid strenuous activities for the next 1 week   Subjective: Patient was seen and examined this morning.  Lying on bed.  Not in distress..  Muscle soreness has improved.  Feels ready to go home. Afebrile, hemodynamically stable, breathing room air Labs from this morning with creatinine 4800, AST, ALT improving  Brief narrative: Paul Prince is a 48 y.o. male with PMH significant for HTN, depression, migraine/cluster headache, sciatica.  10/26, patient presented to [facility] health urgent care with complaint of bilateral arm swelling progressive for 4 days after lifting weights more than his usual. With concern of rhabdomyolysis, he was sent to the ED. On initial workup, his renal function was normal, CK was elevated to 18,000, AST/ALT were elevated Urinalysis unremarkable, negative for protein UDS negative Started on IV fluids Admitted to [facility]  Hospital course: Severe rhabdomyolysis Elevated transaminases Rhabdomyolysis secondary to strenuous workout CK level and transaminases level were elevated but renal function normal With IV hydration, CK, AST, ALT levels are downtrending as below.  Muscle soreness improving. He has been adequately hydrated.  I think he can be discharged home today to continue oral hydration. Recent Labs  Lab 10/08/24 1412 10/09/24 1824 10/10/24 0517 10/11/24 0412 10/12/24 0428  CKTOTAL 18,112* >20,000* 16,830* 9,501* 4,821*   Recent Labs  Lab 10/08/24 1412 10/09/24 1824 10/10/24 0517 10/11/24 0412 10/12/24 0428  AST 491* 358* 270* 182* 118*  ALT 239* 221* 182* 157* 134*  ALKPHOS 49 48 40 42 45  BILITOT  0.6 0.4 0.2 0.3 0.2  PROT 6.8 6.9 5.9* 6.0* 5.9*  ALBUMIN 3.9 3.9 3.3*  3.3* 3.4* 3.4*    Migraine headaches Avoid NSAIDs for now  can resume as an outpatient  Sciatica Clinically stable    Goals of care   Code Status: Full Code   Diet:  Diet Order             Diet general           Diet regular Room service appropriate? Yes; Fluid consistency: Thin  Diet effective now                   Nutritional status:  Body mass index is 27.8 kg/m.       Wounds:  -    Discharge Medications:   Allergies as of 10/12/2024       Reactions   Verapamil     Stated causing bruising        Medication List     TAKE these medications    Emgality (300 MG Dose) 100 MG/ML Sosy Generic drug: Galcanezumab-gnlm Inject 300 mg into the skin every 30 (thirty) days.   GOODY HEADACHE PO Take 1-3 packets by mouth daily as needed (for migraine).   SUMAtriptan  6 MG/0.5ML Soaj 1 injection every 2 hours AS NEEDED.  LIMIT 2/DAY         Follow ups:    Follow-up Information     Rollene Almarie LABOR, MD Follow up.   Specialty: Internal Medicine Contact information: [address] [place] [address] [phone]                 Discharge Instructions:   Discharge Instructions  Call MD for:  difficulty breathing, headache or visual disturbances   Complete by: As directed    Call MD for:  extreme fatigue   Complete by: As directed    Call MD for:  hives   Complete by: As directed    Call MD for:  persistant dizziness or light-headedness   Complete by: As directed    Call MD for:  persistant nausea and vomiting   Complete by: As directed    Call MD for:  severe uncontrolled pain   Complete by: As directed    Call MD for:  temperature >100.4   Complete by: As directed    Diet general   Complete by: As directed    Discharge instructions   Complete by: As directed    Recommendations at discharge:   Encourage oral hydration  Follow-up with  PCP next 1 to 2 weeks to repeat CK level  Avoid strenuous activities for the next 1 week  General discharge instructions: Follow with Primary MD Rollene Almarie LABOR, MD in 7 days  Please request your PCP  to go over your hospital tests, procedures, radiology results at the follow up. Please get your medicines reviewed and adjusted.  Your PCP may decide to repeat certain labs or tests as needed. Do not drive, operate heavy machinery, perform activities at heights, swimming or participation in water activities or provide baby sitting services if your were admitted for syncope or siezures until you have seen by Primary MD or a Neurologist and advised to do so again. [place]  Controlled Substance Reporting System database was reviewed. Do not drive, operate heavy machinery, perform activities at heights, swim, participate in water activities or provide baby-sitting services while on medications for pain, sleep and mood until your outpatient physician has reevaluated you and advised to do so again.  You are strongly recommended to comply with the dose, frequency and duration of prescribed medications. Activity: As tolerated with Full fall precautions use walker/cane & assistance as needed Avoid using any recreational substances like cigarette, tobacco, alcohol, or non-prescribed drug. If you experience worsening of your admission symptoms, develop shortness of breath, life threatening emergency, suicidal or homicidal thoughts you must seek medical attention immediately by calling 911 or calling your MD immediately  if symptoms less severe. You must read complete instructions/literature along with all the possible adverse reactions/side effects for all the medicines you take and that have been prescribed to you. Take any new medicine only after you have completely understood and accepted all the possible adverse reactions/side effects.  Wear Seat belts while driving. You were cared for by a  hospitalist during your hospital stay. If you have any questions about your discharge medications or the care you received while you were in the hospital after you are discharged, you can call the unit and ask to speak with the hospitalist or the covering physician. Once you are discharged, your primary care physician will handle any further medical issues. Please note that NO REFILLS for any discharge medications will be authorized once you are discharged, as it is imperative that you return to your primary care physician (or establish a relationship with a primary care physician if you do not have one).   Increase activity slowly   Complete by: As directed        Discharge Exam:   Vitals:   10/11/24 1424 10/11/24 1948 10/12/24 0444 10/12/24 0720  BP: (!) 119/59 132/69 125/73 132/80  Pulse: 66 60 64 (!) 55  Resp: 18 17 15 16   Temp: 98.5 F (36.9 C) 98.6 F (37 C) 98.4 F (36.9 C) 98.2 F (36.8 C)  TempSrc: Oral Oral Oral Oral  SpO2: 100% 100% 100% 100%  Weight:      Height:        Body mass index is 27.8 kg/m.   General exam: Pleasant, middle-aged African-American male Skin: No rashes, lesions or ulcers. HEENT: Atraumatic, normocephalic, no obvious bleeding Lungs: Clear to auscultation bilaterally,  CVS: S1, S2, no murmur,   GI/Abd: Soft, nontender, nondistended, bowel sound present,   CNS: Alert, awake, oriented x 3 Psychiatry: Mood appropriate Extremities: No pedal edema, no calf tenderness, bilateral arm soreness improving   The results of significant diagnostics from this hospitalization (including imaging, microbiology, ancillary and laboratory) are listed below for reference.    Procedures and Diagnostic Studies:   DG Chest 2 View Result Date: 10/09/2024 EXAM: 2 VIEW(S) XRAY OF THE CHEST 10/09/2024 06:56:00 PM COMPARISON: None available. CLINICAL HISTORY: edema. Triage notes:Patient sent from UC to get labs and a IV. Patient reports that his arms are swollen. They  have been swollen for the past 4-5 days.  FINDINGS: LUNGS AND PLEURA: No focal pulmonary opacity. No pulmonary edema. No pleural effusion. No pneumothorax. HEART AND MEDIASTINUM: No acute abnormality of the cardiac and mediastinal silhouettes. BONES AND SOFT TISSUES: No acute osseous abnormality. IMPRESSION: 1. No acute process. Electronically signed by: Dorethia Molt MD 10/09/2024 07:26 PM EDT RP Workstation: [id number]     Labs:   Basic Metabolic Panel: Recent Labs  Lab 10/08/24 1412 10/09/24 1824 10/10/24 0517 10/11/24 0412 10/12/24 0428  NA 137 136 139 136 139  K 3.7 4.2 3.9 4.0 3.9  CL 103 102 108 104 104  CO2 26 24 23 23 25   GLUCOSE 69* 85 82 92 89  BUN 10 11 9 10 9   CREATININE 0.85 0.86 0.79 0.79 0.84  CALCIUM 9.2 9.1 8.3* 9.1 8.7*  PHOS  --   --  3.8  --   --    GFR Estimated Creatinine Clearance: 116.8 mL/min (by C-G formula based on SCr of 0.84 mg/dL). Liver Function Tests: Recent Labs  Lab 10/08/24 1412 10/09/24 1824 10/10/24 0517 10/11/24 0412 10/12/24 0428  AST 491* 358* 270* 182* 118*  ALT 239* 221* 182* 157* 134*  ALKPHOS 49 48 40 42 45  BILITOT 0.6 0.4 0.2 0.3 0.2  PROT 6.8 6.9 5.9* 6.0* 5.9*  ALBUMIN 3.9 3.9 3.3*  3.3* 3.4* 3.4*   No results for input(s): LIPASE, AMYLASE in the last 168 hours. No results for input(s): AMMONIA in the last 168 hours. Coagulation profile No results for input(s): INR, PROTIME in the last 168 hours.  CBC: Recent Labs  Lab 10/08/24 1412 10/09/24 1824  WBC 7.4 6.0  NEUTROABS 4.3 3.2  HGB 12.9* 12.9*  HCT 39.0 39.1  MCV 94.4 93.5  PLT 278 303   Cardiac Enzymes: Recent Labs  Lab 10/08/24 1412 10/09/24 1824 10/10/24 0517 10/11/24 0412 10/12/24 0428  CKTOTAL 18,112* >20,000* 16,830* 9,501* 4,821*   BNP: Invalid input(s): POCBNP CBG: No results for input(s): GLUCAP in the last 168 hours. D-Dimer No results for input(s): DDIMER in the last 72 hours. Hgb A1c No results for input(s):  HGBA1C in the last 72 hours. Lipid Profile No results for input(s): CHOL, HDL, LDLCALC, TRIG, CHOLHDL, LDLDIRECT in the last 72 hours. Thyroid  function studies No results for input(s): TSH, T4TOTAL, T3FREE, THYROIDAB in the last 72 hours.  Invalid input(s): FREET3  Anemia work up No results for input(s): VITAMINB12, FOLATE, FERRITIN, TIBC, IRON, RETICCTPCT in the last 72 hours. Microbiology No results found for this or any previous visit (from the past 240 hours).  Time coordinating discharge: 45 minutes  Signed: Chandi Nicklin  [facility] 10/12/2024, 1:10 PM

## 2024-10-12 NOTE — Progress Notes (Signed)
 Reviewed discharge instructions, and discharged.

## 2024-10-12 NOTE — Progress Notes (Signed)
   10/12/24 1321  TOC Brief Assessment  Insurance and Status Reviewed  Patient has primary care physician Yes  Home environment has been reviewed home  Prior level of function: self/independent  Prior/Current Home Services No current home services  Social Drivers of Health Review SDOH reviewed no interventions necessary  Readmission risk has been reviewed Yes  Transition of care needs no transition of care needs at this time    Pt stable for transition home today. No needs noted.

## 2024-10-12 NOTE — Plan of Care (Signed)
 Problem: Education: Goal: Knowledge of General Education information will improve Description: Including pain rating scale, medication(s)/side effects and non-pharmacologic comfort measures 10/12/2024 1402 by Kimber Selinda GAILS, RN Outcome: Adequate for Discharge 10/12/2024 1402 by Kimber Selinda GAILS, RN Outcome: Adequate for Discharge   Problem: Health Behavior/Discharge Planning: Goal: Ability to manage health-related needs will improve 10/12/2024 1402 by Kimber Selinda GAILS, RN Outcome: Adequate for Discharge 10/12/2024 1402 by Kimber Selinda GAILS, RN Outcome: Adequate for Discharge   Problem: Clinical Measurements: Goal: Ability to maintain clinical measurements within normal limits will improve 10/12/2024 1402 by Kimber Selinda GAILS, RN Outcome: Adequate for Discharge 10/12/2024 1402 by Kimber Selinda GAILS, RN Outcome: Adequate for Discharge Goal: Will remain free from infection 10/12/2024 1402 by Kimber Selinda GAILS, RN Outcome: Adequate for Discharge 10/12/2024 1402 by Kimber Selinda GAILS, RN Outcome: Adequate for Discharge Goal: Diagnostic test results will improve 10/12/2024 1402 by Kimber Selinda GAILS, RN Outcome: Adequate for Discharge 10/12/2024 1402 by Kimber Selinda GAILS, RN Outcome: Adequate for Discharge Goal: Respiratory complications will improve 10/12/2024 1402 by Kimber Selinda GAILS, RN Outcome: Adequate for Discharge 10/12/2024 1402 by Kimber Selinda GAILS, RN Outcome: Adequate for Discharge Goal: Cardiovascular complication will be avoided 10/12/2024 1402 by Kimber Selinda GAILS, RN Outcome: Adequate for Discharge 10/12/2024 1402 by Kimber Selinda GAILS, RN Outcome: Adequate for Discharge   Problem: Activity: Goal: Risk for activity intolerance will decrease 10/12/2024 1402 by Kimber Selinda GAILS, RN Outcome: Adequate for Discharge 10/12/2024 1402 by Kimber Selinda GAILS, RN Outcome: Adequate for Discharge   Problem: Nutrition: Goal: Adequate nutrition will be maintained 10/12/2024 1402 by Kimber Selinda GAILS,  RN Outcome: Adequate for Discharge 10/12/2024 1402 by Kimber Selinda GAILS, RN Outcome: Adequate for Discharge   Problem: Coping: Goal: Level of anxiety will decrease 10/12/2024 1402 by Kimber Selinda GAILS, RN Outcome: Adequate for Discharge 10/12/2024 1402 by Kimber Selinda GAILS, RN Outcome: Adequate for Discharge   Problem: Elimination: Goal: Will not experience complications related to bowel motility 10/12/2024 1402 by Kimber Selinda GAILS, RN Outcome: Adequate for Discharge 10/12/2024 1402 by Kimber Selinda GAILS, RN Outcome: Adequate for Discharge Goal: Will not experience complications related to urinary retention 10/12/2024 1402 by Kimber Selinda GAILS, RN Outcome: Adequate for Discharge 10/12/2024 1402 by Kimber Selinda GAILS, RN Outcome: Adequate for Discharge   Problem: Pain Managment: Goal: General experience of comfort will improve and/or be controlled 10/12/2024 1402 by Kimber Selinda GAILS, RN Outcome: Adequate for Discharge 10/12/2024 1402 by Kimber Selinda GAILS, RN Outcome: Adequate for Discharge   Problem: Safety: Goal: Ability to remain free from injury will improve 10/12/2024 1402 by Kimber Selinda GAILS, RN Outcome: Adequate for Discharge 10/12/2024 1402 by Kimber Selinda GAILS, RN Outcome: Adequate for Discharge   Problem: Skin Integrity: Goal: Risk for impaired skin integrity will decrease 10/12/2024 1402 by Kimber Selinda GAILS, RN Outcome: Adequate for Discharge 10/12/2024 1402 by Kimber Selinda GAILS, RN Outcome: Adequate for Discharge   Problem: Education: Goal: Knowledge of General Education information will improve Description: Including pain rating scale, medication(s)/side effects and non-pharmacologic comfort measures 10/12/2024 1402 by Kimber Selinda GAILS, RN Outcome: Adequate for Discharge 10/12/2024 1402 by Kimber Selinda GAILS, RN Outcome: Adequate for Discharge   Problem: Health Behavior/Discharge Planning: Goal: Ability to manage health-related needs will improve 10/12/2024 1402 by Kimber Selinda GAILS, RN Outcome: Adequate for Discharge 10/12/2024 1402 by Kimber Selinda GAILS, RN Outcome: Adequate for Discharge   Problem: Clinical Measurements: Goal: Ability to maintain clinical measurements within normal limits will improve 10/12/2024 1402 by  Kimber Selinda GAILS, RN Outcome: Adequate for Discharge 10/12/2024 1402 by Kimber Selinda GAILS, RN Outcome: Adequate for Discharge Goal: Will remain free from infection 10/12/2024 1402 by Kimber Selinda GAILS, RN Outcome: Adequate for Discharge 10/12/2024 1402 by Kimber Selinda GAILS, RN Outcome: Adequate for Discharge Goal: Diagnostic test results will improve 10/12/2024 1402 by Kimber Selinda GAILS, RN Outcome: Adequate for Discharge 10/12/2024 1402 by Kimber Selinda GAILS, RN Outcome: Adequate for Discharge Goal: Respiratory complications will improve 10/12/2024 1402 by Kimber Selinda GAILS, RN Outcome: Adequate for Discharge 10/12/2024 1402 by Kimber Selinda GAILS, RN Outcome: Adequate for Discharge Goal: Cardiovascular complication will be avoided 10/12/2024 1402 by Kimber Selinda GAILS, RN Outcome: Adequate for Discharge 10/12/2024 1402 by Kimber Selinda GAILS, RN Outcome: Adequate for Discharge   Problem: Activity: Goal: Risk for activity intolerance will decrease 10/12/2024 1402 by Kimber Selinda GAILS, RN Outcome: Adequate for Discharge 10/12/2024 1402 by Kimber Selinda GAILS, RN Outcome: Adequate for Discharge   Problem: Nutrition: Goal: Adequate nutrition will be maintained 10/12/2024 1402 by Kimber Selinda GAILS, RN Outcome: Adequate for Discharge 10/12/2024 1402 by Kimber Selinda GAILS, RN Outcome: Adequate for Discharge   Problem: Coping: Goal: Level of anxiety will decrease 10/12/2024 1402 by Kimber Selinda GAILS, RN Outcome: Adequate for Discharge 10/12/2024 1402 by Kimber Selinda GAILS, RN Outcome: Adequate for Discharge   Problem: Elimination: Goal: Will not experience complications related to bowel motility 10/12/2024 1402 by Kimber Selinda GAILS, RN Outcome: Adequate for  Discharge 10/12/2024 1402 by Kimber Selinda GAILS, RN Outcome: Adequate for Discharge Goal: Will not experience complications related to urinary retention 10/12/2024 1402 by Kimber Selinda GAILS, RN Outcome: Adequate for Discharge 10/12/2024 1402 by Kimber Selinda GAILS, RN Outcome: Adequate for Discharge   Problem: Pain Managment: Goal: General experience of comfort will improve and/or be controlled 10/12/2024 1402 by Kimber Selinda GAILS, RN Outcome: Adequate for Discharge 10/12/2024 1402 by Kimber Selinda GAILS, RN Outcome: Adequate for Discharge   Problem: Safety: Goal: Ability to remain free from injury will improve 10/12/2024 1402 by Kimber Selinda GAILS, RN Outcome: Adequate for Discharge 10/12/2024 1402 by Kimber Selinda GAILS, RN Outcome: Adequate for Discharge   Problem: Skin Integrity: Goal: Risk for impaired skin integrity will decrease 10/12/2024 1402 by Kimber Selinda GAILS, RN Outcome: Adequate for Discharge 10/12/2024 1402 by Kimber Selinda GAILS, RN Outcome: Adequate for Discharge

## 2024-10-13 ENCOUNTER — Telehealth: Payer: Self-pay

## 2024-10-13 NOTE — Transitions of Care (Post Inpatient/ED Visit) (Signed)
   10/13/2024  Name: Paul Prince MRN: [id number] DOB: 28-Jan-1976  Today's TOC FU Call Status: Today's TOC FU Call Status:: Successful TOC FU Call Completed TOC FU Call Complete Date: 10/13/24 Patient's Name and Date of Birth confirmed.  Transition Care Management Follow-up Telephone Call Date of Discharge: 10/12/24 Discharge Facility: [facility]) Type of Discharge: Inpatient Admission Primary Inpatient Discharge Diagnosis:: rabdomylolysis How have you been since you were released from the hospital?: Better Any questions or concerns?: No  Items Reviewed: Did you receive and understand the discharge instructions provided?: Yes Medications obtained,verified, and reconciled?: Yes (Medications Reviewed) Any new allergies since your discharge?: No Dietary orders reviewed?: Yes Do you have support at home?: No  Medications Reviewed Today: Medications Reviewed Today     Reviewed by Emmitt Pan, LPN (Licensed Practical Nurse) on 10/13/24 at 1040  Med List Status: <None>   Medication Order Taking? Sig Documenting Provider Last Dose Status Informant  Aspirin-Acetaminophen -Caffeine (GOODY HEADACHE PO) [id number] Yes Take 1-3 packets by mouth daily as needed (for migraine). [provider]  Active Self  EMGALITY, 300 MG DOSE, 100 MG/ML SOSY [id number] Yes Inject 300 mg into the skin every 30 (thirty) days. [provider]  Active Self, Pharmacy Records  SUMAtriptan  6 MG/0.5ML SOAJ [id number]  1 injection every 2 hours AS NEEDED.  LIMIT 2/DAY  Patient not taking: Reported on 10/13/2024   [provider]  Active Self, Pharmacy Records            Home Care and Equipment/Supplies: Were Home Health Services Ordered?: NA Any new equipment or medical supplies ordered?: NA  Functional Questionnaire: Do you need assistance with bathing/showering or dressing?: No Do you need assistance with meal preparation?: No Do you need assistance with eating?:  No Do you have difficulty maintaining continence: No Do you need assistance with getting out of bed/getting out of a chair/moving?: No Do you have difficulty managing or taking your medications?: No  Follow up appointments reviewed: PCP Follow-up appointment confirmed?: Yes Date of PCP follow-up appointment?: 10/20/24 Follow-up Provider: [facility] Follow-up appointment confirmed?: NA Do you need transportation to your follow-up appointment?: No Do you understand care options if your condition(s) worsen?: Yes-patient verbalized understanding    SIGNATURE Pan Emmitt, LPN [facility] Nurse Health Advisor Direct Dial [phone]

## 2024-10-20 ENCOUNTER — Inpatient Hospital Stay: Admitting: Internal Medicine

## 2024-10-20 ENCOUNTER — Encounter: Payer: Self-pay | Admitting: Internal Medicine

## 2024-10-20 ENCOUNTER — Ambulatory Visit: Admitting: Internal Medicine

## 2024-10-20 VITALS — BP 130/80 | HR 78 | Temp 98.3°F | Ht 72.0 in | Wt 201.4 lb

## 2024-10-20 DIAGNOSIS — F419 Anxiety disorder, unspecified: Secondary | ICD-10-CM

## 2024-10-20 DIAGNOSIS — F322 Major depressive disorder, single episode, severe without psychotic features: Secondary | ICD-10-CM

## 2024-10-20 DIAGNOSIS — Z1211 Encounter for screening for malignant neoplasm of colon: Secondary | ICD-10-CM

## 2024-10-20 DIAGNOSIS — M6282 Rhabdomyolysis: Secondary | ICD-10-CM

## 2024-10-20 MED ORDER — SERTRALINE HCL 50 MG PO TABS: 50.0000 mg | ORAL_TABLET | Freq: Every day | ORAL | 3 refills | Status: AC

## 2024-10-20 NOTE — Assessment & Plan Note (Signed)
 His insomnia and anxiety with depression are likely exacerbated by recent hospitalization and headaches. Prescribed nighttime medication zoloft 50 mg daily for anxiety and sleep. Reassess anxiety and sleep in one month.

## 2024-10-20 NOTE — Assessment & Plan Note (Signed)
 His insomnia and anxiety are likely exacerbated by recent hospitalization and headaches. Prescribed nighttime medication zoloft 50 mg daily for anxiety and sleep. Reassess anxiety and sleep in one month.

## 2024-10-20 NOTE — Patient Instructions (Addendum)
 We will bring you back in 1 month for a physical and do your labs then.  We will start zoloft (sertraline) to take 1 pill daily in the evening to help with anxiety. It can take up to a month to work

## 2024-10-20 NOTE — Progress Notes (Signed)
   Subjective:   Patient ID: Paul Prince, male    DOB: 05/31/1976, 48 y.o.   MRN: [id number]  Discussed the use of AI scribe software for clinical note transcription with the patient, who gave verbal consent to proceed.  History of Present Illness Paul Prince is a 48 year old male who presents for follow-up after hospitalization for elevated creatine kinase (CK) levels.  He was hospitalized due to significantly elevated CK levels, which peaked at 20,000 and decreased to approximately 4,800 by discharge.  Since his hospital stay, he has experienced poor sleep, averaging about three hours per night, due to headaches and increased anxiety. He describes his anxiety as 'out the roof.'  He wants to schedule a colonoscopy.  No new chest pains or breathing troubles.  Med reconciliation done from discharge list.   Review of Systems  Constitutional: Negative.   HENT: Negative.    Eyes: Negative.   Respiratory:  Negative for cough, chest tightness and shortness of breath.   Cardiovascular:  Negative for chest pain, palpitations and leg swelling.  Gastrointestinal:  Negative for abdominal distention, abdominal pain, constipation, diarrhea, nausea and vomiting.  Musculoskeletal: Negative.   Skin: Negative.   Neurological: Negative.   Psychiatric/Behavioral:  Positive for decreased concentration, dysphoric mood and sleep disturbance. The patient is nervous/anxious.     Objective:  Physical Exam Constitutional:      Appearance: He is well-developed.  HENT:     Head: Normocephalic and atraumatic.  Cardiovascular:     Rate and Rhythm: Normal rate and regular rhythm.  Pulmonary:     Effort: Pulmonary effort is normal. No respiratory distress.     Breath sounds: Normal breath sounds. No wheezing or rales.  Abdominal:     General: Bowel sounds are normal. There is no distension.     Palpations: Abdomen is soft.     Tenderness: There is no abdominal tenderness.  Musculoskeletal:      Cervical back: Normal range of motion.  Skin:    General: Skin is warm and dry.  Neurological:     Mental Status: He is alert and oriented to person, place, and time.     Coordination: Coordination normal.     Vitals:   10/20/24 1053  Pulse: 78  Temp: 98.3 F (36.8 C)  TempSrc: Oral  SpO2: 99%  Weight: 201 lb 6.4 oz (91.4 kg)  Height: 6' (1.829 m)    Assessment and Plan Assessment & Plan Rhabdomyolysis   He had a previous episode with elevated CK and abnormal liver function tests, both of which improved but not normalized by discharge. Recheck CK levels and liver function tests in one month.  Insomnia and Anxiety   His insomnia and anxiety are likely exacerbated by recent hospitalization and headaches. Prescribed nighttime medication zoloft 50 mg daily for anxiety and sleep. Reassess anxiety and sleep in one month.

## 2024-10-20 NOTE — Assessment & Plan Note (Signed)
 He had a previous episode with elevated CK and abnormal liver function tests, both of which improved but not normalized by discharge. Recheck CK levels and liver function tests in one month.

## 2025-01-03 ENCOUNTER — Encounter: Payer: Self-pay | Admitting: Gastroenterology

## 2025-01-18 ENCOUNTER — Encounter: Payer: Self-pay | Admitting: Gastroenterology

## 2025-01-18 ENCOUNTER — Ambulatory Visit: Admitting: *Deleted

## 2025-01-18 VITALS — Ht 72.0 in | Wt 205.0 lb

## 2025-01-18 DIAGNOSIS — Z1211 Encounter for screening for malignant neoplasm of colon: Secondary | ICD-10-CM

## 2025-01-18 MED ORDER — NA SULFATE-K SULFATE-MG SULF 17.5-3.13-1.6 GM/177ML PO SOLN: 1.0000 | Freq: Once | ORAL | 0 refills | Status: AC

## 2025-01-18 NOTE — Progress Notes (Signed)
 Pt's name and DOB verified at the beginning of the pre-visit with 2 identifiers  Pt denies any difficulty with ambulating,sitting, laying down or rolling side to side  Pt has no issues moving head neck or swallowing  No egg or soy allergy known to patient   No issues known to pt with past sedation  No FH of Malignant Hyperthermia  Pt is not on home 02   Pt is not on blood thinners   Pt denies issues with constipation   Pt is not on dialysis  Pt denise any abnormal heart rhythms   Pt denies any upcoming cardiac testing  Patient's chart reviewed by Norleen Schillings CNRA prior to pre-visit and patient appropriate for the [facility].  Pre-visit completed and red dot placed by patient's name on their procedure day (on provider's schedule).     Visit in person  Pt states weight is 205 lb   Pt given  both [facility] main # and MD on call # prior to instructions.  Informed pt to come in at the time discussed and is shown on PV instructions.  Pt instructed to use [employer] or [employer] for a price reduction on prep  Instructed pt where to find PV instructions in My Chart and copy given to pt. . Instructed pt on all aspects of written instructions including med holds clothing to wear and foods to eat and not eat as well as after procedure legal restrictions and to call MD on call if needed.. Pt states understanding. Instructed pt to review instructions again prior to procedure and call main # given if has any questions or any issues. Pt states they will.

## 2025-02-01 ENCOUNTER — Encounter: Admitting: Gastroenterology
